# Patient Record
Sex: Female | Born: 1985 | Race: White | Hispanic: No | Marital: Single | Smoking: Never smoker
Health system: Southern US, Community
[De-identification: ages and names within clinical notes are randomized; demographics above are authoritative.]

## PROBLEM LIST (undated history)

## (undated) DIAGNOSIS — G4733 Obstructive sleep apnea (adult) (pediatric): Secondary | ICD-10-CM

## (undated) DIAGNOSIS — E282 Polycystic ovarian syndrome: Secondary | ICD-10-CM

## (undated) DIAGNOSIS — T8859XA Other complications of anesthesia, initial encounter: Secondary | ICD-10-CM

## (undated) DIAGNOSIS — F329 Major depressive disorder, single episode, unspecified: Secondary | ICD-10-CM

## (undated) DIAGNOSIS — F909 Attention-deficit hyperactivity disorder, unspecified type: Secondary | ICD-10-CM

## (undated) DIAGNOSIS — T4145XA Adverse effect of unspecified anesthetic, initial encounter: Secondary | ICD-10-CM

## (undated) DIAGNOSIS — F32A Depression, unspecified: Secondary | ICD-10-CM

## (undated) HISTORY — PX: LITHOTRIPSY: SUR834

## (undated) HISTORY — PX: OTHER SURGICAL HISTORY: SHX169

## (undated) HISTORY — DX: Attention-deficit hyperactivity disorder, unspecified type: F90.9

## (undated) HISTORY — DX: Obstructive sleep apnea (adult) (pediatric): G47.33

## (undated) HISTORY — DX: Major depressive disorder, single episode, unspecified: F32.9

## (undated) HISTORY — DX: Depression, unspecified: F32.A

## (undated) HISTORY — DX: Polycystic ovarian syndrome: E28.2

---

## 2010-11-23 HISTORY — PX: KIDNEY STONE SURGERY: SHX686

## 2011-06-30 ENCOUNTER — Encounter (INDEPENDENT_AMBULATORY_CARE_PROVIDER_SITE_OTHER): Payer: Self-pay | Admitting: General Surgery

## 2011-07-01 ENCOUNTER — Ambulatory Visit (INDEPENDENT_AMBULATORY_CARE_PROVIDER_SITE_OTHER): Payer: BC Managed Care – PPO | Admitting: Surgery

## 2011-08-04 ENCOUNTER — Encounter (INDEPENDENT_AMBULATORY_CARE_PROVIDER_SITE_OTHER): Payer: Self-pay | Admitting: General Surgery

## 2011-08-05 ENCOUNTER — Encounter (INDEPENDENT_AMBULATORY_CARE_PROVIDER_SITE_OTHER): Payer: Self-pay | Admitting: Surgery

## 2011-08-05 ENCOUNTER — Ambulatory Visit (INDEPENDENT_AMBULATORY_CARE_PROVIDER_SITE_OTHER): Payer: BC Managed Care – PPO | Admitting: Surgery

## 2011-08-05 DIAGNOSIS — F909 Attention-deficit hyperactivity disorder, unspecified type: Secondary | ICD-10-CM | POA: Insufficient documentation

## 2011-08-05 NOTE — Progress Notes (Signed)
Subjective:     Patient ID: Brianna Nicholson, female   DOB: [birth year], 25 y.o.   MRN: [id number]  HPI Brianna Nicholson is a 25 year old white female from [place] who is currently in graduate school at [employer] studying bioethics. She grew up in [place] where she was a Medical sales representative at [employer].  She has been overweight all of her life and both her parents have a family history is obesity as well as diabetes. Her comorbidities include PCO S., and depression. She also has ADHD and an kidney stones. Aside from the right flank incision that Dr. Barbette Hair placed in her right upper quadrant to repair her collecting system.  She has been to our seminar and wants to proceed with Roux-en-Y gastric bypass. I discussed LAP-BAND with her as well as bypass and sleeve. We'll go ahead and proceed along the journey toward Roux-en-Y gastric bypass. Past Medical History  Diagnosis Date  . Depression   . PCOS (polycystic ovarian syndrome)   . 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 ADHD (attention deficit hyperactivity disorder)    Current Outpatient Prescriptions  Medication Sig Dispense Refill  . methylphenidate (RITALIN) 20 MG tablet Take 20 mg by mouth 3 (three) times daily.         Past Surgical History  Procedure Date  . Lithotripsy 2010, 2011  . Upj obstruction 1984  . Kidney stone surgery 2012   Family History  Problem Relation Age of Onset  . Diabetes Mother   . Hypertension Mother   . Hyperlipidemia Mother   . Obesity Mother   . Obesity Father   . Diabetes Father   . Hyperlipidemia Father   . Hypertension Father    family history includes Diabetes in her father and mother; Hyperlipidemia in her father and mother; Hypertension in her father and mother; and Obesity in her father and mother. History   Social History  . Marital Status: Single    Spouse Name: N/A    Number of Children: N/A  . Years of Education:  N/A   Occupational History  . Not on file.   Social History Main Topics  . Smoking status: Never Smoker   . Smokeless tobacco: Not on file  . Alcohol Use: No  . Drug Use: No  . Sexually Active:    Other Topics Concern  . Not on file   Social History Narrative  . No narrative on file      Review of Systems  Constitutional: Negative.   HENT: Negative.   Eyes: Negative.   Respiratory: Negative.   Cardiovascular: Negative.   Gastrointestinal: Negative.   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  Musculoskeletal: Negative.   Neurological: Negative.   Psychiatric/Behavioral: Negative.        Objective:   Physical Exam  Constitutional: She is oriented to person, place, and time. She appears well-developed and well-nourished.  HENT:  Head: Normocephalic and atraumatic.  Eyes: Conjunctivae and EOM are normal. Pupils are equal, round, and reactive to light.  Neck: Neck supple.  Cardiovascular: Normal rate, regular rhythm and normal heart sounds.   Pulmonary/Chest: Effort normal and breath sounds normal.  Abdominal: Soft.       Right upper quadrant transverse incison  Musculoskeletal: Normal range of motion.       No history of DVT  Neurological:  She is alert and oriented to person, place, and time. She has normal reflexes.  Skin: Skin is warm.  Psychiatric: She has a normal mood and affect. Her behavior is normal. Judgment and thought content normal.  BP 142/90  Pulse 92  Temp(Src) 97.6 F (36.4 C) (Temporal)  Resp 20  Ht 5\' 6"  (1.676 m)  Wt 341 lb 12.8 oz (155.039 kg)  BMI 55.17 kg/m2  Filed Vitals:   08/05/11 1032  BP: 142/90  Pulse: 92  Temp: 97.6 F (36.4 C)  Resp: 20      Assessment:     Morbid obesity BMI 55 .  Would benefit from lap Roux en Y gastric bypass    Plan:     Begin bariatric workup for gastric bypass

## 2011-08-06 ENCOUNTER — Other Ambulatory Visit (INDEPENDENT_AMBULATORY_CARE_PROVIDER_SITE_OTHER): Payer: Self-pay | Admitting: Surgery

## 2011-08-12 ENCOUNTER — Encounter: Payer: BC Managed Care – PPO | Attending: Surgery | Admitting: *Deleted

## 2011-08-12 ENCOUNTER — Encounter: Payer: Self-pay | Admitting: *Deleted

## 2011-08-12 DIAGNOSIS — Z01818 Encounter for other preprocedural examination: Secondary | ICD-10-CM | POA: Insufficient documentation

## 2011-08-12 DIAGNOSIS — Z713 Dietary counseling and surveillance: Secondary | ICD-10-CM | POA: Insufficient documentation

## 2011-08-12 NOTE — Patient Instructions (Signed)
   Follow Pre-Op Nutrition Goals to prepare for Gastric Bypass Surgery.   Call the [facility] at [phone] once you have been given your surgery date to enrolled in the Pre-Op Nutrition Class. You will need to attend this nutrition class 3-4 weeks prior to your surgery. 

## 2011-08-12 NOTE — Progress Notes (Signed)
  Patient was seen on 08/12/2011 for Pre-Operative Gastric Bypass Nutrition Assessment. Assessment and letter of approval faxed to [facility] Bariatric Surgery Program coordinator on 08/12/2011.    Handouts given during visit include:  Pre-Op Goals Handout  Patient to call for Pre-Op and Post-Op Nutrition Education at the [facility] when surgery is scheduled.

## 2011-08-13 ENCOUNTER — Encounter (HOSPITAL_COMMUNITY): Payer: BC Managed Care – PPO

## 2011-08-17 ENCOUNTER — Encounter (HOSPITAL_COMMUNITY): Payer: BC Managed Care – PPO

## 2011-08-18 ENCOUNTER — Ambulatory Visit (HOSPITAL_BASED_OUTPATIENT_CLINIC_OR_DEPARTMENT_OTHER): Payer: BC Managed Care – PPO | Attending: Surgery

## 2011-08-18 ENCOUNTER — Ambulatory Visit (HOSPITAL_COMMUNITY): Admission: RE | Admit: 2011-08-18 | Payer: BC Managed Care – PPO | Source: Ambulatory Visit | Admitting: Surgery

## 2011-08-18 DIAGNOSIS — G4733 Obstructive sleep apnea (adult) (pediatric): Secondary | ICD-10-CM

## 2011-08-18 HISTORY — DX: Obstructive sleep apnea (adult) (pediatric): G47.33

## 2011-08-19 DIAGNOSIS — R0989 Other specified symptoms and signs involving the circulatory and respiratory systems: Secondary | ICD-10-CM

## 2011-08-19 DIAGNOSIS — R0609 Other forms of dyspnea: Secondary | ICD-10-CM

## 2011-08-19 DIAGNOSIS — M62838 Other muscle spasm: Secondary | ICD-10-CM

## 2011-08-19 DIAGNOSIS — G4733 Obstructive sleep apnea (adult) (pediatric): Secondary | ICD-10-CM

## 2011-08-19 NOTE — Procedures (Signed)
NAMEROBIN, PETRAKIS                   ACCOUNT NO.:  [id number]  MEDICAL RECORD NO.:  [id number]          PATIENT TYPE:  OUT  LOCATION:  SLEEP CENTER                 FACILITY[facility]  [facility]  PHYSICIAN:  Clinton D. Maple Hudson, MD, FCCP, FACPDATE OF BIRTH:  04/28/1988  DATE OF STUDY:  08/18/2011                           NOCTURNAL POLYSOMNOGRAM  REFERRING PHYSICIAN:  Thornton Park. Daphine Deutscher, MD  INDICATION FOR STUDY:  Hypersomnia with sleep apnea.  EPWORTH SLEEPINESS SCORE:  5/24.  BMI 55.7.  Weight 345 pounds, height 66 inches.  Neck 16.5 inches.  MEDICATIONS:  Home medications are charted and reviewed.  SLEEP ARCHITECTURE:  Split study protocol.  During the diagnostic phase, total sleep time 137.5 minutes with sleep efficiency 68.8%.  Stage I was 18.9%, stage II 81.1%, stage III and REM were absent.  Sleep latency 42.5 minutes, REM latency NA.  Awake after sleep onset 19 minutes, arousal index 43.2.  BEDTIME MEDICATION:  None.  RESPIRATORY DATA:  Split study protocol.  Apnea/hypopnea index (AHI) 85.1 per hour.  A total of 195 events were scored including 4 obstructive apneas and 191 hypopneas.  Events were associated with non- supine sleep.  REM was absent.  CPAP was then titrated to 11 CWP, AHI 0 per hour.  She wore a medium ResMed Mirage Quattro full-face mask with heated humidifier and a C-Flex setting of 3.  OXYGEN DATA:  Mild-to-moderate snoring before CPAP with oxygen desaturation to a nadir of 84% on room air.  With CPAP titration, mean oxygen saturation of 93.2% on room air and snoring was prevented.  CARDIAC DATA:  Normal sinus rhythm.  MOVEMENT-PARASOMNIA:  A few limb jerks were noted with insignificant effect on sleep.  No bathroom trips.  IMPRESSIONS-RECOMMENDATIONS: 1. Severe obstructive sleep apnea/hypopnea syndrome, AHI 85.1 per     hour.  Non-supine events with mild-to-moderate snoring and oxygen     desaturation to a nadir of 84% on room air. 2. Successful continuous positive  airway pressure titration to 11 CWP,     AHI 0 per hour.  She wore a medium     ResMed Mirage Quattro full-face mask with heated humidifier and C-     Flex setting of three.  Snoring was prevented and oxygenation     normalized.     Clinton D. Maple Hudson, MD, [facility], FACP Diplomate, Biomedical engineer of Sleep Medicine Electronically Signed    CDY/MEDQ  D:  08/19/2011 10:58:31  T:  08/19/2011 12:14:16  Job:  [id number]

## 2011-08-20 ENCOUNTER — Ambulatory Visit (HOSPITAL_COMMUNITY)
Admission: RE | Admit: 2011-08-20 | Discharge: 2011-08-20 | Disposition: A | Discharge status: Home / Self Care | Payer: BC Managed Care – PPO | Source: Ambulatory Visit | Attending: Surgery | Admitting: Surgery

## 2011-08-20 ENCOUNTER — Other Ambulatory Visit: Payer: Self-pay

## 2011-08-20 DIAGNOSIS — Z6841 Body Mass Index (BMI) 40.0 and over, adult: Secondary | ICD-10-CM | POA: Insufficient documentation

## 2011-08-20 DIAGNOSIS — K7689 Other specified diseases of liver: Secondary | ICD-10-CM | POA: Insufficient documentation

## 2011-08-20 DIAGNOSIS — Z13820 Encounter for screening for osteoporosis: Secondary | ICD-10-CM | POA: Insufficient documentation

## 2011-08-21 ENCOUNTER — Encounter (HOSPITAL_COMMUNITY): Payer: BC Managed Care – PPO

## 2011-08-25 ENCOUNTER — Encounter (HOSPITAL_COMMUNITY): Payer: BC Managed Care – PPO

## 2011-09-30 ENCOUNTER — Other Ambulatory Visit (INDEPENDENT_AMBULATORY_CARE_PROVIDER_SITE_OTHER): Payer: Self-pay

## 2011-09-30 ENCOUNTER — Other Ambulatory Visit (INDEPENDENT_AMBULATORY_CARE_PROVIDER_SITE_OTHER): Payer: Self-pay | Admitting: Surgery

## 2011-09-30 ENCOUNTER — Ambulatory Visit (INDEPENDENT_AMBULATORY_CARE_PROVIDER_SITE_OTHER): Payer: BC Managed Care – PPO | Admitting: Pulmonary Disease

## 2011-09-30 ENCOUNTER — Encounter: Payer: Self-pay | Admitting: *Deleted

## 2011-09-30 ENCOUNTER — Encounter: Payer: Self-pay | Admitting: Pulmonary Disease

## 2011-09-30 VITALS — BP 150/82 | HR 109 | Temp 99.1°F | Ht 66.0 in | Wt 342.6 lb

## 2011-09-30 DIAGNOSIS — F909 Attention-deficit hyperactivity disorder, unspecified type: Secondary | ICD-10-CM

## 2011-09-30 DIAGNOSIS — G4733 Obstructive sleep apnea (adult) (pediatric): Secondary | ICD-10-CM

## 2011-09-30 NOTE — Assessment & Plan Note (Signed)
She has severe sleep apnea.  I have reviewed his sleep test results with the patient.  Explained how sleep apnea can affect the patient's health.  Driving precautions and importance of weight loss were discussed.  Treatment options for sleep apnea were reviewed.  Will proceed with setting up CPAP 11 cm H2O.  Advised her that she can proceed with bariatric surgery.  She will need to have her sleep apnea re-evaluated after surgery once her weight stabilizes.

## 2011-09-30 NOTE — Progress Notes (Deleted)
  Subjective:    Patient ID: Brianna Nicholson, female    DOB: Apr 03, 1986, 25 y.o.   MRN: [id number]  HPI    Review of Systems  Constitutional: Positive for unexpected weight change.       Objective:   Physical Exam        Assessment & Plan:

## 2011-09-30 NOTE — Assessment & Plan Note (Signed)
Advised her that sleep apnea can mimic symptoms of ADHD.  She will need to have this re-evaluated once she is established on therapy for her sleep apnea.

## 2011-09-30 NOTE — Patient Instructions (Signed)
Will arrange for CPAP set up  Follow up in 3 months 

## 2011-09-30 NOTE — Progress Notes (Signed)
Chief Complaint  Patient presents with  . Advice Only    Pt had sleep study last month adn was not given results. Pt c/o difficulty sleeping x 7 years. Pt wake sup in middle night, has vivid dreams    History of Present Illness:  Brianna Nicholson is a 25 y.o. female for evaluation of sleep apnea.  She is being evaluated for bariatric surgery.  During this evaluation she had sleep study from August 18, 2011>>AHI 85 with SpO2 low 84%.  She used CPAP 11 cm H2O with reduction in AHI to 0.  She felt her sleep was improved with CPAP.  She goes to bed at 11 pm.  She can take up to an hour to fall asleep.  She will occasionally use a tylenol pm.  She wakes up several times during the night with vivid dreams.  She gets out of bed at 8 am.  She will feel groggy initially, but improve as the day goes on.  She denies morning headaches.  She will occasionally take ritalin for ADHD, but does not drink caffeine beverages.  She has been told she snores.  The patient denies sleep walking, sleep talking, bruxism, or nightmares.  There is no history of restless legs.  The patient denies sleep hallucinations, sleep paralysis, or cataplexy.  Her Epworth score is 5 out of 24.    Past Medical History  Diagnosis Date  . Depression   . PCOS (polycystic ovarian syndrome)   . 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 ADHD (attention deficit hyperactivity disorder)   . OSA (obstructive sleep apnea) 08/18/11    Past Surgical History  Procedure Date  . Lithotripsy 2010, 2011  . Upj obstruction 1984  . Kidney stone surgery 2012    Current Outpatient Prescriptions on File Prior to Visit  Medication Sig Dispense Refill  . methylphenidate (RITALIN) 20 MG tablet Take 20 mg by mouth 3 (three) times daily.          No Known Allergies  family history includes COPD in her mother; Cancer in her maternal grandmother; Colon cancer in her paternal  uncle; Diabetes in her father and mother; Hyperlipidemia in her father and mother; Hypertension in her father and mother; and Obesity in her father and mother.   reports that she has never smoked. She does not have any smokeless tobacco history on file. She reports that she does not drink alcohol or use illicit drugs.  Blood pressure 150/82, pulse 109, temperature 99.1 F (37.3 C), temperature source Oral, height 5\' 6"  (1.676 m), weight 342 lb 9.6 oz (155.402 kg), SpO2 97.00%.  Physical Exam:  General - Obese HEENT - PERRLA, EOMI, no sinus tenderness, MP 4, enlarged tongue, no LAN, no thyromegaly Cardiac - s1s2 regular Chest - no wheeze/rales Abdomen - soft, nontender Extremities - no e/c/c Neurologic - normal strength, CN intact Skin - no rashes Psychiatric - normal mood, behavior  Assessment/Plan:  OSA (obstructive sleep apnea) She has severe sleep apnea.  I have reviewed his sleep test results with the patient.  Explained how sleep apnea can affect the patient's health.  Driving precautions and importance of weight loss were discussed.  Treatment options for sleep apnea were reviewed.  Will proceed with setting up CPAP 11 cm H2O.  Advised her that she can proceed with bariatric surgery.  She will need to have her sleep apnea re-evaluated after surgery once her weight stabilizes.  ADHD (attention deficit hyperactivity disorder) Advised her that sleep apnea can mimic symptoms of ADHD.  She will need to have this re-evaluated once she is established on therapy for her sleep apnea.     Outpatient Encounter Prescriptions as of 09/30/2011  Medication Sig Dispense Refill  . methylphenidate (RITALIN) 20 MG tablet Take 20 mg by mouth 3 (three) times daily.          Brianna Nicholson 09/30/2011, 3:06 PM

## 2011-10-01 LAB — COMPREHENSIVE METABOLIC PANEL
Alkaline Phosphatase: 70 U/L (ref 39–117)
BUN: 9 mg/dL (ref 6–23)
Glucose, Bld: 91 mg/dL (ref 70–99)
Sodium: 139 mEq/L (ref 135–145)
Total Bilirubin: 0.5 mg/dL (ref 0.3–1.2)

## 2011-10-01 LAB — CBC WITH DIFFERENTIAL/PLATELET
Basophils Relative: 0 % (ref 0–1)
Eosinophils Absolute: 0.1 10*3/uL (ref 0.0–0.7)
Hemoglobin: 14.8 g/dL (ref 12.0–15.0)
MCH: 31 pg (ref 26.0–34.0)
MCHC: 34.2 g/dL (ref 30.0–36.0)
Monocytes Absolute: 0.6 10*3/uL (ref 0.1–1.0)
Monocytes Relative: 6 % (ref 3–12)
Neutrophils Relative %: 69 % (ref 43–77)

## 2011-10-01 LAB — LIPID PANEL
HDL: 40 mg/dL (ref >39)
LDL Cholesterol: 159 mg/dL — ABNORMAL HIGH (ref 0–99)
Total CHOL/HDL Ratio: 6.2 Ratio
Triglycerides: 238 mg/dL — ABNORMAL HIGH (ref <150)
VLDL: 48 mg/dL — ABNORMAL HIGH (ref 0–40)

## 2011-10-16 ENCOUNTER — Encounter: Payer: Self-pay | Admitting: Surgery

## 2012-01-15 ENCOUNTER — Ambulatory Visit: Payer: BC Managed Care – PPO | Admitting: *Deleted

## 2012-01-26 NOTE — Patient Instructions (Signed)
Follow:    Pre-Op Diet per MD 2 weeks prior to surgery  Phase 2- Liquids (clear/full) 2 weeks after surgery  Vitamin/Mineral/Calcium guidelines for purchasing bariatric supplements  Exercise guidelines pre and post-op per MD  Follow-up at [facility] in 2 weeks post-op for diet advancement. Contact Markella Dao as needed with questions/concerns.  

## 2012-01-26 NOTE — Progress Notes (Signed)
  Bariatric Class:  Appt start time: 0830 end time:  0930.  Pre-Operative Nutrition Class  Patient was seen on 01/28/12 for Pre-Operative Bariatric Surgery Education at the [facility].  Surgery date: 3/25 Surgery type: Gastric Bypass Last weight (08/12/11): 337.1 lbs  Samples given per MNT protocol: Bariatric Advantage Multivitamin Lot # [id number] Exp: 9/13  Bariatric Advantage Calcium Citrate Lot # [id number] Exp: 12/13  Bariatric Advantage B12 Lot # [id number] Exp: 5/13  Celebrate Vitamins Multivitamin Lot # [id number] Exp: 6/14  Celebrate VitaminsCalcium Citrate Lot # [id number] Exp: 10/14  Unjury Protein - Chicken Soup Lot# [id number] Exp: 5/14  The following the learning objective met by the patient during this course:   Identifies Pre-Op Dietary Goals and will begin 2 weeks pre-operatively   Identifies appropriate sources of fluids and proteins   States protein recommendations and appropriate sources pre and post-operatively  Identifies Post-Operative Dietary Goals and will follow for 2 weeks post-operatively  Identifies appropriate multivitamin and calcium sources  Describes the need for physical activity post-operatively and will follow MD recommendations  States when to call healthcare provider regarding medication questions or post-operative complications  Handouts given during class include:  Pre-Op Bariatric Surgery Diet Handout  Protein Shake Handout  Post-Op Bariatric Surgery Nutrition Handout  BELT Program Information Flyer  Support Group Information Flyer  Follow-Up Plan: Patient will follow-up at [facility] 2 weeks post operatively for diet advancement per MD.

## 2012-01-28 ENCOUNTER — Encounter: Payer: BC Managed Care – PPO | Attending: Surgery

## 2012-01-28 DIAGNOSIS — Z01818 Encounter for other preprocedural examination: Secondary | ICD-10-CM | POA: Insufficient documentation

## 2012-01-28 DIAGNOSIS — Z713 Dietary counseling and surveillance: Secondary | ICD-10-CM | POA: Insufficient documentation

## 2012-02-03 ENCOUNTER — Encounter (HOSPITAL_COMMUNITY): Payer: Self-pay | Admitting: Pharmacy Technician

## 2012-02-05 ENCOUNTER — Ambulatory Visit (INDEPENDENT_AMBULATORY_CARE_PROVIDER_SITE_OTHER): Payer: BC Managed Care – PPO | Admitting: Surgery

## 2012-02-05 ENCOUNTER — Encounter (INDEPENDENT_AMBULATORY_CARE_PROVIDER_SITE_OTHER): Payer: Self-pay | Admitting: Surgery

## 2012-02-05 VITALS — BP 136/98 | HR 100 | Temp 98.2°F | Resp 18 | Ht 66.0 in | Wt 343.1 lb

## 2012-02-05 DIAGNOSIS — E669 Obesity, unspecified: Secondary | ICD-10-CM | POA: Insufficient documentation

## 2012-02-05 NOTE — Progress Notes (Signed)
Subjective:     Patient ID: Brianna Nicholson, female   DOB: 1986-01-06, 26 y.o.   MRN: [id number]  HPI Brianna Nicholson is a 26 year old white female from [place] who is currently in graduate school at [employer] studying bioethics. 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  She has been overweight all of her life and both her parents have a family history is obesity as well as diabetes. Her comorbidities include PCO S., and depression. She also has ADHD and an kidney stones. Aside from the right flank incision that Dr. Barbette Hair placed in her right upper quadrant to repair her collecting system.  She has been to our seminar and wants to proceed with Roux-en-Y gastric bypass. I discussed LAP-BAND with her as well as bypass and sleeve. We'll go ahead and proceed along the journey toward Roux-en-Y gastric bypass. UGI normal.  Ultra sound no gallstones.   Past Medical History  Diagnosis Date  . Depression   . PCOS (polycystic ovarian syndrome)   . 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 ADHD (attention deficit hyperactivity disorder)   . OSA (obstructive sleep apnea) 08/18/11   Current Outpatient Prescriptions  Medication Sig Dispense Refill  . methylphenidate (RITALIN) 20 MG tablet Take 20 mg by mouth 3 (three) times daily. Patient states that she only takes as needed. Maybe 5 to 7 tablets per week.       Past Surgical History  Procedure Date  . Lithotripsy 2010, 2011  . Upj obstruction 1984  . Kidney stone surgery 2012   Family History  Problem Relation Age of Onset  . Diabetes Mother   . Hypertension Mother   . Hyperlipidemia Mother   . Obesity Mother   . Obesity Father   . Diabetes Father   . Hyperlipidemia Father   . Hypertension Father   . COPD Mother   . Cancer Maternal Grandmother   . Colon cancer Paternal Uncle    family history includes COPD in her mother; Cancer in her  maternal grandmother; Colon cancer in her paternal uncle; Diabetes in her father and mother; Hyperlipidemia in her father and mother; Hypertension in her father and mother; and Obesity in her father and mother. History   Social History  . Marital Status: Single    Spouse Name: N/A    Number of Children: N/A  . Years of Education: N/A   Occupational History  . grad student/data entry clerk     [employer]   Social History Main Topics  . Smoking status: Never Smoker   . Smokeless tobacco: Not on file  . Alcohol Use: No  . Drug Use: No  . Sexually Active: Not on file   Other Topics Concern  . Not on file   Social History Narrative  . No narrative on file      Review of Systems  Constitutional: Negative.   HENT: Negative.   Eyes: Negative.   Respiratory: Negative.   Cardiovascular: Negative.   Gastrointestinal: Negative.   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  Musculoskeletal: Negative.   Neurological: Negative.   Psychiatric/Behavioral: Negative.        Objective:   Physical Exam  Constitutional: She is oriented to person, place, and time. She appears well-developed and well-nourished.  HENT:  Head: Normocephalic and atraumatic.  Eyes: Conjunctivae and EOM are normal. Pupils are equal, round, and reactive to light.  Neck: Neck supple.  Cardiovascular: Normal rate, regular rhythm and normal heart sounds.   Pulmonary/Chest: Effort normal and breath sounds normal.  Abdominal: Soft.       Right upper quadrant transverse incison  Musculoskeletal: Normal range of motion.       No history of DVT  Neurological: She is alert and oriented to person, place, and time. She has normal reflexes.  Skin: Skin is warm.  Psychiatric: She has a normal mood and affect. Her behavior is normal. Judgment and thought content normal.  BP 136/98  Pulse 100  Temp(Src) 98.2 F (36.8 C) (Temporal)   Resp 18  Ht 5\' 6"  (1.676 m)  Wt 343 lb 2 oz (155.64 kg)  BMI 55.38 kg/m2  Filed Vitals:   02/05/12 1414  BP: 136/98  Pulse: 100  Temp: 98.2 F (36.8 C)  Resp: 18      Assessment:     Morbid obesity BMI 55 .  Would benefit from lap Roux en Y gastric bypass    Plan:    HG A1C elevated.  Plan lap roux en Y gastric bypass

## 2012-02-09 ENCOUNTER — Encounter (HOSPITAL_COMMUNITY)
Admission: RE | Admit: 2012-02-09 | Discharge: 2012-02-09 | Disposition: A | Discharge status: Home / Self Care | Payer: BC Managed Care – PPO | Source: Ambulatory Visit | Attending: Surgery | Admitting: Surgery

## 2012-02-09 ENCOUNTER — Encounter (HOSPITAL_COMMUNITY): Payer: Self-pay

## 2012-02-09 HISTORY — DX: Adverse effect of unspecified anesthetic, initial encounter: T41.45XA

## 2012-02-09 HISTORY — DX: Other complications of anesthesia, initial encounter: T88.59XA

## 2012-02-09 LAB — DIFFERENTIAL
Basophils Absolute: 0.1 K/uL (ref 0.0–0.1)
Basophils Relative: 1 % (ref 0–1)
Eosinophils Absolute: 0.1 K/uL (ref 0.0–0.7)
Eosinophils Relative: 1 % (ref 0–5)
Lymphocytes Relative: 31 % (ref 12–46)
Lymphs Abs: 2.9 K/uL (ref 0.7–4.0)
Monocytes Absolute: 0.5 K/uL (ref 0.1–1.0)
Monocytes Relative: 5 % (ref 3–12)
Neutro Abs: 5.6 K/uL (ref 1.7–7.7)
Neutrophils Relative %: 62 % (ref 43–77)

## 2012-02-09 LAB — CBC
HCT: 40.5 % (ref 36.0–46.0)
Hemoglobin: 14.3 g/dL (ref 12.0–15.0)
RDW: 12.2 % (ref 11.5–15.5)
WBC: 9.2 10*3/uL (ref 4.0–10.5)

## 2012-02-09 LAB — COMPREHENSIVE METABOLIC PANEL
ALT: 37 U/L — ABNORMAL HIGH (ref 0–35)
Albumin: 4.1 g/dL (ref 3.5–5.2)
Alkaline Phosphatase: 87 U/L (ref 39–117)
BUN: 6 mg/dL (ref 6–23)
Chloride: 104 mEq/L (ref 96–112)
Glucose, Bld: 100 mg/dL — ABNORMAL HIGH (ref 70–99)
Potassium: 3.8 mEq/L (ref 3.5–5.1)
Total Bilirubin: 0.3 mg/dL (ref 0.3–1.2)

## 2012-02-09 LAB — SURGICAL PCR SCREEN
MRSA, PCR: NEGATIVE
Staphylococcus aureus: NEGATIVE

## 2012-02-09 NOTE — Patient Instructions (Signed)
YOUR SURGERY IS SCHEDULED ON:   Monday  3/25  AT 11:41 AM  REPORT TO [facility] AT:  9:15 AM      PHONE # FOR SHORT STAY IS [phone]  DO NOT EAT OR DRINK ANYTHING AFTER MIDNIGHT THE NIGHT BEFORE YOUR SURGERY.  YOU MAY BRUSH YOUR TEETH, RINSE OUT YOUR MOUTH--BUT NO WATER, NO FOOD, NO CHEWING GUM, NO MINTS, NO CANDIES, NO CHEWING TOBACCO.  PLEASE TAKE THE FOLLOWING MEDICATIONS THE AM OF YOUR SURGERY WITH A FEW SIPS OF WATER:  NO MEDS TO TAKE    IF YOU USE INHALERS--USE YOUR INHALERS THE AM OF YOUR SURGERY AND BRING INHALERS TO THE HOSPITAL -TAKE TO SURGERY.    IF YOU ARE DIABETIC:  DO NOT TAKE ANY DIABETIC MEDICATIONS THE AM OF YOUR SURGERY.  IF YOU TAKE INSULIN IN THE EVENINGS--PLEASE ONLY TAKE 1/2 NORMAL EVENING DOSE THE NIGHT BEFORE YOUR SURGERY.  NO INSULIN THE AM OF YOUR SURGERY.  IF YOU HAVE SLEEP APNEA AND USE CPAP OR BIPAP--PLEASE BRING THE MASK --NOT THE MACHINE-NOT THE TUBING   -JUST THE MASK. DO NOT BRING VALUABLES, MONEY, CREDIT CARDS.  CONTACT LENS, DENTURES / PARTIALS, GLASSES SHOULD NOT BE WORN TO SURGERY AND IN MOST CASES-HEARING AIDS WILL NEED TO BE REMOVED.  BRING YOUR GLASSES CASE, ANY EQUIPMENT NEEDED FOR YOUR CONTACT LENS. FOR PATIENTS ADMITTED TO THE HOSPITAL--CHECK OUT TIME THE DAY OF DISCHARGE IS 11:00 AM.  ALL INPATIENT ROOMS ARE PRIVATE - WITH BATHROOM, TELEPHONE, TELEVISION AND WIFI INTERNET. IF YOU ARE BEING DISCHARGED THE SAME DAY OF YOUR SURGERY--YOU CAN NOT DRIVE YOURSELF HOME--AND SHOULD NOT GO HOME ALONE BY TAXI OR BUS.  NO DRIVING OR OPERATING MACHINERY FOR 24 HOURS FOLLOWING ANESTHESIA / PAIN MEDICATIONS.                            SPECIAL INSTRUCTIONS:  CHLORHEXIDINE SOAP SHOWER (other brand names are Betasept and Hibiclens ) PLEASE SHOWER WITH CHLORHEXIDINE THE NIGHT BEFORE YOUR SURGERY AND THE AM OF YOUR SURGERY. DO NOT USE CHLORHEXIDINE ON YOUR FACE OR PRIVATE AREAS--YOU MAY USE YOUR NORMAL SOAP THOSE AREAS AND YOUR NORMAL SHAMPOO.    WOMEN SHOULD AVOID SHAVING UNDER ARMS AND SHAVING LEGS 48 HOURS BEFORE USING CHLORHEXIDINE TO AVOID SKIN IRRITATION.  DO NOT USE IF ALLERGIC TO CHLORHEXIDINE.  PLEASE READ OVER ANY  FACT SHEETS THAT YOU WERE GIVEN: MRSA INFORMATION, INCENTIVE SPIROMETER INFORMATION.

## 2012-02-09 NOTE — Pre-Procedure Instructions (Signed)
EKG AND CXR REPORTS 08/20/11 FROM [facility] ARE IN EPIC AND COPIES ON THIS CHART. SLEEP STUDY REPORT 08/18/11 FROM [facility] ON THIS CHART AND IN EPIC. CBC WITH DIFF, CMET WERE DONE TODAY--STAT URINE PREGNACY TEST TO BE DONE ON PT'S ARRIVAL FOR SURGERY.

## 2012-02-13 ENCOUNTER — Telehealth (INDEPENDENT_AMBULATORY_CARE_PROVIDER_SITE_OTHER): Payer: Self-pay | Admitting: General Surgery

## 2012-02-13 NOTE — Telephone Encounter (Signed)
She had questions about how to take her bowel prep and I gave her instructions on that.

## 2012-02-14 MED ORDER — DEXTROSE 5 % IV SOLN
2.0000 g | INTRAVENOUS | Status: AC
  Administered 2012-02-15: 2 g via INTRAVENOUS
  Filled 2012-02-14: qty 2

## 2012-02-14 NOTE — H&P (Addendum)
Patient ID: Brianna Nicholson, female DOB: 02-19-1986, 26 y.o. MRN: [id number]  HPI Brianna Nicholson is a 26 year old white female from [place] who is currently in graduate school at [employer] studying bioethics. She grew up in [place] where she was a Medical sales representative at [employer]. She has been overweight all of her life and both her parents have a family history is obesity as well as diabetes. Her comorbidities include PCO S., and depression. She also has ADHD and an kidney stones. Aside from the right flank incision that Dr. Barbette Hair placed in her right upper quadrant to repair her collecting system.  She has been to our seminar and wants to proceed with Roux-en-Y gastric bypass. I discussed LAP-BAND with her as well as bypass and sleeve. We'll go ahead and proceed along the journey toward Roux-en-Y gastric bypass. UGI normal. Ultra sound no gallstones.  Past Medical History   Diagnosis  Date   .  Depression    .  PCOS (polycystic ovarian syndrome)    .  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  ADHD (attention deficit hyperactivity disorder)    .  OSA (obstructive sleep apnea)  08/18/11    Current Outpatient Prescriptions   Medication  Sig  Dispense  Refill   .  methylphenidate (RITALIN) 20 MG tablet  Take 20 mg by mouth 3 (three) times daily. Patient states that she only takes as needed. Maybe 5 to 7 tablets per week.      Past Surgical History   Procedure  Date   .  Lithotripsy  2010, 2011   .  Upj obstruction  1984   .  Kidney stone surgery  2012    Family History   Problem  Relation  Age of Onset   .  Diabetes  Mother    .  Hypertension  Mother    .  Hyperlipidemia  Mother    .  Obesity  Mother    .  Obesity  Father    .  Diabetes  Father    .  Hyperlipidemia  Father    .  Hypertension  Father    .  COPD  Mother    .  Cancer  Maternal Grandmother    .  Colon cancer  Paternal Uncle     family history  includes COPD in her mother; Cancer in her maternal grandmother; Colon cancer in her paternal uncle; Diabetes in her father and mother; Hyperlipidemia in her father and mother; Hypertension in her father and mother; and Obesity in her father and mother.  History    Social History   .  Marital Status:  Single     Spouse Name:  N/A     Number of Children:  N/A   .  Years of Education:  N/A    Occupational History   .  grad student/data entry clerk      [employer]    Social History Main Topics   .  Smoking status:  Never Smoker   .  Smokeless tobacco:  Not on file   .  Alcohol Use:  No   .  Drug Use:  No   .  Sexually Active:  Not on file    Other Topics  Concern   .  Not on file    Social History Narrative   .  No narrative on file    Review  of Systems  Constitutional: Negative.  HENT: Negative.  Eyes: Negative.  Respiratory: Negative.  Cardiovascular: Negative.  Gastrointestinal: Negative.  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  Musculoskeletal: Negative.  Neurological: Negative.  Psychiatric/Behavioral: Negative.   Objective:   Physical Exam  Constitutional: She is oriented to person, place, and time. She appears well-developed and well-nourished.  HENT:  Head: Normocephalic and atraumatic.  Eyes: Conjunctivae and EOM are normal. Pupils are equal, round, and reactive to light.  Neck: Neck supple.  Cardiovascular: Normal rate, regular rhythm and normal heart sounds.  Pulmonary/Chest: Effort normal and breath sounds normal.  Abdominal: Soft.  Right upper quadrant transverse incison  Musculoskeletal: Normal range of motion.  No history of DVT  Neurological: She is alert and oriented to person, place, and time. She has normal reflexes.  Skin: Skin is warm.  Psychiatric: She has a normal mood and affect. Her behavior is normal. Judgment and thought content normal.  BP 136/98  Pulse  100  Temp(Src) 98.2 F (36.8 C) (Temporal)  Resp 18  Ht 5\' 6"  (1.676 m)  Wt 343 lb 2 oz (155.64 kg)  BMI 55.38 kg/m2  Filed Vitals:    02/05/12 1414   BP:  136/98   Pulse:  100   Temp:  98.2 F (36.8 C)   Resp:  18     Assessment:    Morbid obesity BMI 55 . Would benefit from lap Roux en Y gastric bypass   Plan:   HG A1C elevated. Plan lap roux en Y gastric bypass  Matt B. Daphine Deutscher, MD, [facility] [phone] beeper [phone]  02/14/2012 10:35 PM  There has been no change in the patient's past medical history or physical exam in the past 24 hours to the best of my knowledge. I examined the patient in the holding area and have made any changes to the history and physical exam report that is included above.   Expectations and outcome results have been discussed with the patient to include risks and benefits.  All questions have been answered and we will proceed with previously discussed procedure noted and signed in the consent form in the patient's record.    Darene Nappi BMD FACS 11:53 AM  02/15/2012

## 2012-02-15 ENCOUNTER — Encounter (HOSPITAL_COMMUNITY): Payer: Self-pay | Admitting: Certified Registered Nurse Anesthetist

## 2012-02-15 ENCOUNTER — Encounter (HOSPITAL_COMMUNITY)
Admission: RE | Disposition: A | Discharge status: Home / Self Care | Payer: Self-pay | Source: Ambulatory Visit | Attending: Surgery

## 2012-02-15 ENCOUNTER — Inpatient Hospital Stay (HOSPITAL_COMMUNITY)
Admission: RE | Admit: 2012-02-15 | Discharge: 2012-02-17 | DRG: 288 | Disposition: A | Discharge status: Home / Self Care | Payer: BC Managed Care – PPO | Source: Ambulatory Visit | Attending: Surgery | Admitting: Surgery

## 2012-02-15 ENCOUNTER — Ambulatory Visit (HOSPITAL_COMMUNITY): Payer: BC Managed Care – PPO | Admitting: Certified Registered Nurse Anesthetist

## 2012-02-15 ENCOUNTER — Encounter (HOSPITAL_COMMUNITY): Payer: Self-pay | Admitting: *Deleted

## 2012-02-15 DIAGNOSIS — F3289 Other specified depressive episodes: Secondary | ICD-10-CM | POA: Diagnosis present

## 2012-02-15 DIAGNOSIS — Z01812 Encounter for preprocedural laboratory examination: Secondary | ICD-10-CM

## 2012-02-15 DIAGNOSIS — Z6841 Body Mass Index (BMI) 40.0 and over, adult: Secondary | ICD-10-CM

## 2012-02-15 DIAGNOSIS — F909 Attention-deficit hyperactivity disorder, unspecified type: Secondary | ICD-10-CM | POA: Diagnosis present

## 2012-02-15 DIAGNOSIS — G4733 Obstructive sleep apnea (adult) (pediatric): Secondary | ICD-10-CM | POA: Diagnosis present

## 2012-02-15 DIAGNOSIS — E669 Obesity, unspecified: Secondary | ICD-10-CM

## 2012-02-15 DIAGNOSIS — F329 Major depressive disorder, single episode, unspecified: Secondary | ICD-10-CM | POA: Diagnosis present

## 2012-02-15 HISTORY — PX: GASTRIC ROUX-EN-Y: SHX5262

## 2012-02-15 LAB — CBC
HCT: 39.5 % (ref 36.0–46.0)
Hemoglobin: 13.4 g/dL (ref 12.0–15.0)
MCH: 30.6 pg (ref 26.0–34.0)
MCV: 90.2 fL (ref 78.0–100.0)
Platelets: 288 10*3/uL (ref 150–400)
RBC: 4.38 MIL/uL (ref 3.87–5.11)
RDW: 12.3 % (ref 11.5–15.5)
WBC: 16.3 10*3/uL — ABNORMAL HIGH (ref 4.0–10.5)

## 2012-02-15 LAB — CREATININE, SERUM
Creatinine, Ser: 0.72 mg/dL (ref 0.50–1.10)
GFR calc non Af Amer: >90 mL/min (ref >90)

## 2012-02-15 SURGERY — LAPAROSCOPIC ROUX-EN-Y GASTRIC
Anesthesia: General | Site: Abdomen | Wound class: Clean Contaminated

## 2012-02-15 MED ORDER — MORPHINE SULFATE 4 MG/ML IJ SOLN
INTRAMUSCULAR | Status: AC
  Filled 2012-02-15: qty 1

## 2012-02-15 MED ORDER — ACETAMINOPHEN 160 MG/5ML PO SOLN: 650.0000 mg | ORAL | Status: DC | PRN

## 2012-02-15 MED ORDER — ACETAMINOPHEN 10 MG/ML IV SOLN
INTRAVENOUS | Status: AC
  Filled 2012-02-15: qty 100

## 2012-02-15 MED ORDER — KCL IN DEXTROSE-NACL 20-5-0.45 MEQ/L-%-% IV SOLN
INTRAVENOUS | Status: DC
  Administered 2012-02-15 – 2012-02-17 (×5): via INTRAVENOUS
  Filled 2012-02-15 (×7): qty 1000

## 2012-02-15 MED ORDER — ROCURONIUM BROMIDE 100 MG/10ML IV SOLN
INTRAVENOUS | Status: DC | PRN
  Administered 2012-02-15: 20 mg via INTRAVENOUS
  Administered 2012-02-15: 50 mg via INTRAVENOUS
  Administered 2012-02-15 (×2): 20 mg via INTRAVENOUS
  Administered 2012-02-15 (×2): 10 mg via INTRAVENOUS
  Administered 2012-02-15 (×2): 20 mg via INTRAVENOUS

## 2012-02-15 MED ORDER — GLYCOPYRROLATE 0.2 MG/ML IJ SOLN
INTRAMUSCULAR | Status: DC | PRN
  Administered 2012-02-15: 0.6 mg via INTRAVENOUS

## 2012-02-15 MED ORDER — DEXAMETHASONE SODIUM PHOSPHATE 10 MG/ML IJ SOLN
INTRAMUSCULAR | Status: DC | PRN
  Administered 2012-02-15: 10 mg via INTRAVENOUS

## 2012-02-15 MED ORDER — HEPARIN SODIUM (PORCINE) 5000 UNIT/ML IJ SOLN
5000.0000 [IU] | INTRAMUSCULAR | Status: AC
  Administered 2012-02-15: 5000 [IU] via SUBCUTANEOUS

## 2012-02-15 MED ORDER — LACTATED RINGERS IV SOLN
INTRAVENOUS | Status: DC | PRN
  Administered 2012-02-15 (×3): via INTRAVENOUS

## 2012-02-15 MED ORDER — ONDANSETRON HCL 4 MG/2ML IJ SOLN
INTRAMUSCULAR | Status: DC | PRN
  Administered 2012-02-15: 4 mg via INTRAVENOUS

## 2012-02-15 MED ORDER — LABETALOL HCL 5 MG/ML IV SOLN
INTRAVENOUS | Status: DC | PRN
  Administered 2012-02-15 (×2): 5 mg via INTRAVENOUS

## 2012-02-15 MED ORDER — FIBRIN SEALANT COMPONENT 5 ML EX KIT
PACK | CUTANEOUS | Status: AC
Start: 2012-02-15 — End: 2012-02-15
  Filled 2012-02-15: qty 2

## 2012-02-15 MED ORDER — HEPARIN SODIUM (PORCINE) 5000 UNIT/ML IJ SOLN
5000.0000 [IU] | Freq: Three times a day (TID) | INTRAMUSCULAR | Status: DC
Start: 2012-02-15 — End: 2012-02-17
  Administered 2012-02-15 – 2012-02-17 (×5): 5000 [IU] via SUBCUTANEOUS
  Filled 2012-02-15 (×11): qty 1

## 2012-02-15 MED ORDER — BUPIVACAINE LIPOSOME 1.3 % IJ SUSP
20.0000 mL | Freq: Once | INTRAMUSCULAR | Status: DC
  Filled 2012-02-15: qty 20

## 2012-02-15 MED ORDER — LIDOCAINE HCL (CARDIAC) 20 MG/ML IV SOLN
INTRAVENOUS | Status: DC | PRN
  Administered 2012-02-15: 60 mg via INTRAVENOUS

## 2012-02-15 MED ORDER — UNJURY VANILLA POWDER: 2.0000 [oz_av] | Freq: Four times a day (QID) | ORAL | Status: DC

## 2012-02-15 MED ORDER — ONDANSETRON HCL 4 MG/2ML IJ SOLN: 4.0000 mg | INTRAMUSCULAR | Status: DC | PRN

## 2012-02-15 MED ORDER — CEFOXITIN SODIUM-DEXTROSE 1-4 GM-% IV SOLR (PREMIX)
INTRAVENOUS | Status: AC
  Filled 2012-02-15: qty 100

## 2012-02-15 MED ORDER — PROMETHAZINE HCL 25 MG/ML IJ SOLN: 6.2500 mg | INTRAMUSCULAR | Status: DC | PRN

## 2012-02-15 MED ORDER — BUPIVACAINE LIPOSOME 1.3 % IJ SUSP
INTRAMUSCULAR | Status: DC | PRN
  Administered 2012-02-15: 20 mL

## 2012-02-15 MED ORDER — ACETAMINOPHEN 10 MG/ML IV SOLN
INTRAVENOUS | Status: DC | PRN
  Administered 2012-02-15: 1000 mg via INTRAVENOUS

## 2012-02-15 MED ORDER — HEPARIN SODIUM (PORCINE) 5000 UNIT/ML IJ SOLN
INTRAMUSCULAR | Status: AC
  Administered 2012-02-16: 5000 [IU] via SUBCUTANEOUS
  Filled 2012-02-15: qty 1

## 2012-02-15 MED ORDER — OXYCODONE-ACETAMINOPHEN 5-325 MG/5ML PO SOLN
5.0000 mL | ORAL | Status: DC | PRN
  Administered 2012-02-16 – 2012-02-17 (×5): 10 mL via ORAL
  Filled 2012-02-15 (×5): qty 10

## 2012-02-15 MED ORDER — MORPHINE SULFATE 10 MG/ML IJ SOLN
2.0000 mg | INTRAMUSCULAR | Status: DC | PRN
Start: 2012-02-15 — End: 2012-02-17
  Administered 2012-02-15 (×2): 6 mg via INTRAVENOUS
  Administered 2012-02-15 – 2012-02-16 (×3): 4 mg via INTRAVENOUS
  Administered 2012-02-16 (×4): 6 mg via INTRAVENOUS
  Filled 2012-02-15 (×8): qty 1

## 2012-02-15 MED ORDER — 0.9 % SODIUM CHLORIDE (POUR BTL) OPTIME
TOPICAL | Status: DC | PRN
  Administered 2012-02-15: 1000 mL

## 2012-02-15 MED ORDER — SUCCINYLCHOLINE CHLORIDE 20 MG/ML IJ SOLN
INTRAMUSCULAR | Status: DC | PRN
  Administered 2012-02-15: 140 mg via INTRAVENOUS

## 2012-02-15 MED ORDER — PROPOFOL 10 MG/ML IV EMUL
INTRAVENOUS | Status: DC | PRN
  Administered 2012-02-15: 280 mg via INTRAVENOUS

## 2012-02-15 MED ORDER — FENTANYL CITRATE 0.05 MG/ML IJ SOLN
INTRAMUSCULAR | Status: DC | PRN
  Administered 2012-02-15: 100 ug via INTRAVENOUS
  Administered 2012-02-15: 50 ug via INTRAVENOUS
  Administered 2012-02-15: 100 ug via INTRAVENOUS
  Administered 2012-02-15: 50 ug via INTRAVENOUS
  Administered 2012-02-15: 100 ug via INTRAVENOUS
  Administered 2012-02-15 (×3): 50 ug via INTRAVENOUS

## 2012-02-15 MED ORDER — HYDROMORPHONE HCL PF 1 MG/ML IJ SOLN: 0.2500 mg | INTRAMUSCULAR | Status: DC | PRN

## 2012-02-15 MED ORDER — TISSEEL VH 10 ML EX KIT
PACK | CUTANEOUS | Status: DC | PRN
  Administered 2012-02-15: 1

## 2012-02-15 MED ORDER — NEOSTIGMINE METHYLSULFATE 1 MG/ML IJ SOLN
INTRAMUSCULAR | Status: DC | PRN
  Administered 2012-02-15: 5 mg via INTRAVENOUS

## 2012-02-15 MED ORDER — UNJURY CHICKEN SOUP POWDER: 2.0000 [oz_av] | Freq: Four times a day (QID) | ORAL | Status: DC

## 2012-02-15 MED ORDER — UNJURY CHOCOLATE CLASSIC POWDER
2.0000 [oz_av] | Freq: Four times a day (QID) | ORAL | Status: DC
  Administered 2012-02-17: 2 [oz_av] via ORAL

## 2012-02-15 MED ORDER — LACTATED RINGERS IV SOLN
INTRAVENOUS | Status: DC
  Administered 2012-02-15: 1000 mL via INTRAVENOUS

## 2012-02-15 MED ORDER — MIDAZOLAM HCL 5 MG/5ML IJ SOLN
INTRAMUSCULAR | Status: DC | PRN
  Administered 2012-02-15: 2 mg via INTRAVENOUS

## 2012-02-15 MED ORDER — LACTATED RINGERS IR SOLN
Status: DC | PRN
  Administered 2012-02-15: 3000 mL

## 2012-02-15 SURGICAL SUPPLY — 65 items
APPLICATOR COTTON TIP 6IN STRL (MISCELLANEOUS) IMPLANT
BENZOIN TINCTURE PRP APPL 2/3 (GAUZE/BANDAGES/DRESSINGS) IMPLANT
BLADE SURG 15 STRL LF DISP TIS (BLADE) ×1 IMPLANT
BLADE SURG 15 STRL SS (BLADE) ×1
CABLE HIGH FREQUENCY MONO STRZ (ELECTRODE) ×2 IMPLANT
CANISTER SUCTION 2500CC (MISCELLANEOUS) ×2 IMPLANT
CLIP SUT LAPRA TY ABSORB (SUTURE) ×4 IMPLANT
CLOTH BEACON ORANGE TIMEOUT ST (SAFETY) ×2 IMPLANT
COVER SURGICAL LIGHT HANDLE (MISCELLANEOUS) ×2 IMPLANT
DEVICE SUTURE ENDOST 10MM (ENDOMECHANICALS) ×2 IMPLANT
DISSECTOR BLUNT TIP ENDO 5MM (MISCELLANEOUS) ×2 IMPLANT
DRAIN PENROSE 18X1/4 LTX STRL (WOUND CARE) ×2 IMPLANT
DRAPE CAMERA CLOSED 9X96 (DRAPES) ×2 IMPLANT
GAUZE SPONGE 4X4 16PLY XRAY LF (GAUZE/BANDAGES/DRESSINGS) ×2 IMPLANT
GEL PDS (MISCELLANEOUS) ×2 IMPLANT
GLOVE BIOGEL M 8.0 STRL (GLOVE) ×2 IMPLANT
GLOVE SURG SIGNA 7.5 PF LTX (GLOVE) ×2 IMPLANT
GLOVE SURG SS PI 7.5 STRL IVOR (GLOVE) ×2 IMPLANT
GOWN STRL NON-REIN LRG LVL3 (GOWN DISPOSABLE) ×2 IMPLANT
GOWN STRL REIN XL XLG (GOWN DISPOSABLE) ×6 IMPLANT
HANDLE STAPLE EGIA 4 XL (STAPLE) ×2 IMPLANT
HOVERMATT SINGLE USE (MISCELLANEOUS) ×2 IMPLANT
KIT BASIN OR (CUSTOM PROCEDURE TRAY) ×2 IMPLANT
KIT GASTRIC LAVAGE 34FR ADT (SET/KITS/TRAYS/PACK) ×2 IMPLANT
NEEDLE SPNL 22GX3.5 QUINCKE BK (NEEDLE) ×2 IMPLANT
NS IRRIG 1000ML POUR BTL (IV SOLUTION) ×2 IMPLANT
PACK CARDIOVASCULAR III (CUSTOM PROCEDURE TRAY) ×2 IMPLANT
PEN SKIN MARKING BROAD (MISCELLANEOUS) ×2 IMPLANT
RELOAD EGIA 45 MED/THCK PURPLE (STAPLE) ×4 IMPLANT
RELOAD EGIA 45 TAN VASC (STAPLE) ×2 IMPLANT
RELOAD EGIA 60 MED/THCK PURPLE (STAPLE) ×6 IMPLANT
RELOAD EGIA 60 TAN VASC (STAPLE) ×2 IMPLANT
RELOAD ENDO STITCH 2.0 (ENDOMECHANICALS) ×10
SCISSORS LAP 5X35 DISP (ENDOMECHANICALS) ×2 IMPLANT
SEALANT SURGICAL APPL DUAL CAN (MISCELLANEOUS) ×2 IMPLANT
SET IRRIG TUBING LAPAROSCOPIC (IRRIGATION / IRRIGATOR) ×2 IMPLANT
SHEARS CURVED HARMONIC AC 45CM (MISCELLANEOUS) ×2 IMPLANT
SLEEVE ADV FIXATION 12X100MM (TROCAR) ×4 IMPLANT
SLEEVE ADV FIXATION 5X100MM (TROCAR) ×2 IMPLANT
SLEEVE Z-THREAD 12X100MM (TROCAR) IMPLANT
SLEEVE Z-THREAD 5X100MM (TROCAR) IMPLANT
SOLUTION ANTI FOG 6CC (MISCELLANEOUS) ×2 IMPLANT
SPONGE GAUZE 4X4 12PLY (GAUZE/BANDAGES/DRESSINGS) ×2 IMPLANT
STAPLER VISISTAT 35W (STAPLE) ×2 IMPLANT
STRIP CLOSURE SKIN 1/2X4 (GAUZE/BANDAGES/DRESSINGS) IMPLANT
STRIP PERI DRY VERITAS 60 (STAPLE) ×2 IMPLANT
SUT RELOAD ENDO STITCH 2 48X1 (ENDOMECHANICALS) ×5
SUT RELOAD ENDO STITCH 2.0 (ENDOMECHANICALS) ×5
SUT VIC AB 2-0 SH 27 (SUTURE) ×1
SUT VIC AB 2-0 SH 27X BRD (SUTURE) ×1 IMPLANT
SUT VIC AB 4-0 SH 18 (SUTURE) ×2 IMPLANT
SUTURE RELOAD END STTCH 2 48X1 (ENDOMECHANICALS) ×5 IMPLANT
SUTURE RELOAD ENDO STITCH 2.0 (ENDOMECHANICALS) ×5 IMPLANT
SYR 20CC LL (SYRINGE) ×2 IMPLANT
SYR 30ML LL (SYRINGE) ×2 IMPLANT
SYR 50ML LL SCALE MARK (SYRINGE) ×2 IMPLANT
TAPE CLOTH SURG 4X10 WHT LF (GAUZE/BANDAGES/DRESSINGS) ×2 IMPLANT
TRAY FOLEY CATH 14FRSI W/METER (CATHETERS) ×2 IMPLANT
TROCAR ADV FIXATION 12X100MM (TROCAR) ×2 IMPLANT
TROCAR XCEL 12X100 BLDLESS (ENDOMECHANICALS) ×2 IMPLANT
TROCAR Z-THREAD FIOS 12X100MM (TROCAR) IMPLANT
TROCAR Z-THREAD FIOS 5X100MM (TROCAR) ×2 IMPLANT
TUBING CONNECTING 10 (TUBING) ×2 IMPLANT
TUBING ENDO SMARTCAP (MISCELLANEOUS) ×2 IMPLANT
TUBING FILTER THERMOFLATOR (ELECTROSURGICAL) ×2 IMPLANT

## 2012-02-15 NOTE — Progress Notes (Signed)
Clear liquid diet as instructed by doctor.

## 2012-02-15 NOTE — Op Note (Signed)
Surgeon: Pollyann Savoy. Daphine Deutscher, MD, FACS Asst:  Ovidio Kin, M.D., [facility] Anesthesia: General endotracheal Drains: None  Procedure: Laparoscopic Roux en Y gastric bypass with 40 cm BP limb and 100 cm Roux limb, antecolic, antegastric, candy cane to the left.  Closure of Peterson's defect. Upper endoscopy.   Description of Procedure:  The patient was taken to OR 1 at [facility] and given general anesthesia.  The abdomen was prepped with PCMX and draped sterilely.  A time out was performed.    The operation began by identifying the ligament of Treitz. I measured 40 cm downstream and divided the bowel with a 6 cm Covidian stapler.  I sutured a Penrose drain along the Roux limb end.  I measured a 1 meter (100 cm) Roux limb and then placed the distal bowels to the BP limb side by side and performed a stapled jejunojejunostomy. The common defect was closed from either end with 4-0 Vicryl using the Endo Stitch. The mesenteric defect was closed with a running 2-0 silk using the Endo Stitch. Tisseel was applied to the suture line.  The omentum was divided with the harmonic scalpel.  The Nathanson retractor was inserted in the left lateral segment of liver was retracted. The foregut dissection ensued.  I measured 5 cm along the lesser curvature and marked that spot. I entered in the retrogastric space. 2 firings of the purple load Covidien were then deployed to begin the pouch and then to 6 loads and one 4.5 with Peri-Strips were used to complete the creation of the pouch.  The Roux limb was then brought up with the candycane pointed left and a back row of sutures of 2-0 Vicryl were placed. I opened along the right side of each structure and inserted the 4.5 cm stapler to create the gastrojejunostomy. The common defect was closed from either end with 2-0 Vicryl and a second row was placed anterior to that the Ewald tube acting as a stent across the anastomosis. The Penrose drain was removed. Peterson's defect was closed with  2-0 silk.   Endoscopy was performed by Dr. Ezzard Standing which showed a small 5 cm pouch with good gastrojejunostomy. No bleeding. No bubbles were seen within this area was submerged. Tisseel was applied to the gastrojejunostomy.  The incisions were injected with Exparel and were closed with 4-0 Vicryl and staples  The patient was taken to the recovery room in satisfactory condition.  Matt B. Daphine Deutscher, MD, FACS

## 2012-02-15 NOTE — Transfer of Care (Signed)
Immediate Anesthesia Transfer of Care Note  Patient: Brianna Nicholson  Procedure(s) Performed: Procedure(s) (LRB): LAPAROSCOPIC ROUX-EN-Y GASTRIC (N/A)  Patient Location: PACU  Anesthesia Type: General  Level of Consciousness: sedated  Airway & Oxygen Therapy: Patient Spontanous Breathing and Patient connected to face mask oxygen  Post-op Assessment: Report given to PACU RN and Post -op Vital signs reviewed and stable  Post vital signs: Reviewed and stable  Complications: No apparent anesthesia complications

## 2012-02-15 NOTE — Anesthesia Postprocedure Evaluation (Signed)
  Anesthesia Post-op Note  Patient: Brianna Nicholson  Procedure(s) Performed: Procedure(s) (LRB): LAPAROSCOPIC ROUX-EN-Y GASTRIC (N/A)  Patient Location: PACU  Anesthesia Type: General  Level of Consciousness: awake and alert   Airway and Oxygen Therapy: Patient Spontanous Breathing  Post-op Pain: mild  Post-op Assessment: Post-op Vital signs reviewed, Patient's Cardiovascular Status Stable, Respiratory Function Stable, Patent Airway and No signs of Nausea or vomiting  Post-op Vital Signs: stable  Complications: No apparent anesthesia complications

## 2012-02-15 NOTE — Anesthesia Procedure Notes (Signed)
Procedure Name: Intubation Date/Time: 02/15/2012 12:42 PM Performed by: [place], Brisha Mccabe C Pre-anesthesia Checklist: Patient identified, Timeout performed, Emergency Drugs available, Suction available and Patient being monitored Patient Re-evaluated:Patient Re-evaluated prior to inductionOxygen Delivery Method: Circle system utilized Preoxygenation: Pre-oxygenation with 100% oxygen Intubation Type: IV induction Ventilation: Mask ventilation without difficulty Laryngoscope Size: Mac and 3 Grade View: Grade I Tube type: Oral Tube size: 7.5 mm Number of attempts: 1 Airway Equipment and Method: Stylet Placement Confirmation: ETT inserted through vocal cords under direct vision,  breath sounds checked- equal and bilateral,  positive ETCO2 and CO2 detector Secured at: 21 cm Tube secured with: Tape Dental Injury: Teeth and Oropharynx as per pre-operative assessment

## 2012-02-15 NOTE — Progress Notes (Signed)
Pt placed on CPAP at home setting of 11 cmH2o with 2 L/M O2 bled-in.  Pt is tolerating well at this time.  VS are stable.  Pt is using home nasal mask.  Pt encouraged to notify RN/ RT of any concerns.

## 2012-02-15 NOTE — Anesthesia Preprocedure Evaluation (Signed)
Anesthesia Evaluation  Patient identified by MRN, date of birth, ID band Patient awake    Reviewed: Allergy & Precautions, H&P , NPO status , Patient's Chart, lab work & pertinent test results  Airway Mallampati: II TM Distance: >3 FB Neck ROM: Full    Dental No notable dental hx.    Pulmonary sleep apnea ,  breath sounds clear to auscultation  Pulmonary exam normal       Cardiovascular negative cardio ROS  Rhythm:Regular Rate:Normal     Neuro/Psych PSYCHIATRIC DISORDERS Depression negative neurological ROS     GI/Hepatic negative GI ROS, Neg liver ROS,   Endo/Other  negative endocrine ROS  Renal/GU negative Renal ROS  negative genitourinary   Musculoskeletal negative musculoskeletal ROS (+)   Abdominal (+) + obese,   Peds negative pediatric ROS (+)  Hematology negative hematology ROS (+)   Anesthesia Other Findings   Reproductive/Obstetrics negative OB ROS                           Anesthesia Physical Anesthesia Plan  ASA: III  Anesthesia Plan: General   Post-op Pain Management:    Induction: Intravenous  Airway Management Planned: Oral ETT  Additional Equipment:   Intra-op Plan:   Post-operative Plan: Extubation in OR  Informed Consent: I have reviewed the patients History and Physical, chart, labs and discussed the procedure including the risks, benefits and alternatives for the proposed anesthesia with the patient or authorized representative who has indicated his/her understanding and acceptance.   Dental advisory given  Plan Discussed with: CRNA  Anesthesia Plan Comments:         Anesthesia Quick Evaluation

## 2012-02-16 ENCOUNTER — Inpatient Hospital Stay (HOSPITAL_COMMUNITY): Payer: BC Managed Care – PPO

## 2012-02-16 DIAGNOSIS — Z9889 Other specified postprocedural states: Secondary | ICD-10-CM

## 2012-02-16 LAB — DIFFERENTIAL
Basophils Relative: 0 % (ref 0–1)
Eosinophils Absolute: 0 10*3/uL (ref 0.0–0.7)
Monocytes Absolute: 1.1 10*3/uL — ABNORMAL HIGH (ref 0.1–1.0)
Neutrophils Relative %: 82 % — ABNORMAL HIGH (ref 43–77)

## 2012-02-16 LAB — HEMOGLOBIN AND HEMATOCRIT, BLOOD: Hemoglobin: 13.2 g/dL (ref 12.0–15.0)

## 2012-02-16 LAB — CBC
MCH: 30.2 pg (ref 26.0–34.0)
MCHC: 33.8 g/dL (ref 30.0–36.0)
Platelets: 295 10*3/uL (ref 150–400)

## 2012-02-16 MED ORDER — IOHEXOL 300 MG/ML  SOLN
50.0000 mL | Freq: Once | INTRAMUSCULAR | Status: AC | PRN
  Administered 2012-02-16: 50 mL via ORAL

## 2012-02-16 NOTE — Progress Notes (Signed)
Dr. Daphine Deutscher notified of UGI results and orders received to begin POD #1 diet; Colin Mulders, RN aware of results and orders. Talmadge Chad, RN Bariatric Nurse Coordinator

## 2012-02-16 NOTE — Progress Notes (Signed)
Patient ID: Brianna Nicholson, female   DOB: 05-04-1986, 26 y.o.   MRN: [id number] [facility] Progress Note:   1 Day Post-Op  Subjective: Mental status is good; normal Objective: Vital signs in last 24 hours: Temp:  [97.6 F (36.4 C)-99.6 F (37.6 C)] 99.2 F (37.3 C) (03/26 1000) Pulse Rate:  [90-108] 92  (03/26 1000) Resp:  [11-36] 18  (03/26 1000) BP: (111-163)/(29-93) 163/93 mmHg (03/26 1000) SpO2:  [92 %-97 %] 92 % (03/26 1000) Weight:  [337 lb (152.862 kg)] 337 lb (152.862 kg) (03/25 1744)  Intake/Output from previous day: 03/25 0701 - 03/26 0700 In: 4900 [I.V.:4900] Out: 1745 [Urine:1745] Intake/Output this shift: Total I/O In: -  Out: 625 [Urine:625]  Physical Exam: Work of breathing is  Not increased.  Incisions ok  Lab Results:  Results for orders placed during the hospital encounter of 02/15/12 (from the past 48 hour(s))  PREGNANCY, URINE     Status: Normal   Collection Time   02/15/12  9:15 AM      Component Value Range Comment   Preg Test, Ur NEGATIVE  NEGATIVE    CBC     Status: Abnormal   Collection Time   02/15/12  6:04 PM      Component Value Range Comment   WBC 16.3 (*) 4.0 - 10.5 (K/uL)    RBC 4.38  3.87 - 5.11 (MIL/uL)    Hemoglobin 13.4  12.0 - 15.0 (g/dL)    HCT 95.6  21.3 - 08.6 (%)    MCV 90.2  78.0 - 100.0 (fL)    MCH 30.6  26.0 - 34.0 (pg)    MCHC 33.9  30.0 - 36.0 (g/dL)    RDW 57.8  46.9 - 62.9 (%)    Platelets 288  150 - 400 (K/uL)   CREATININE, SERUM     Status: Normal   Collection Time   02/15/12  6:04 PM      Component Value Range Comment   Creatinine, Ser 0.72  0.50 - 1.10 (mg/dL)    GFR calc non Af Amer >90  >90 (mL/min)    GFR calc Af Amer >90  >90 (mL/min)   CBC     Status: Abnormal   Collection Time   02/16/12  5:00 AM      Component Value Range Comment   WBC 15.8 (*) 4.0 - 10.5 (K/uL) WHITE COUNT CONFIRMED ON SMEAR   RBC 4.20  3.87 - 5.11 (MIL/uL)    Hemoglobin 12.7  12.0 - 15.0 (g/dL)    HCT 52.8  41.3 - 24.4  (%)    MCV 89.5  78.0 - 100.0 (fL)    MCH 30.2  26.0 - 34.0 (pg)    MCHC 33.8  30.0 - 36.0 (g/dL)    RDW 01.0  27.2 - 53.6 (%)    Platelets 295  150 - 400 (K/uL)   DIFFERENTIAL     Status: Abnormal   Collection Time   02/16/12  5:00 AM      Component Value Range Comment   Neutrophils Relative 82 (*) 43 - 77 (%)    Lymphocytes Relative 11 (*) 12 - 46 (%)    Monocytes Relative 7  3 - 12 (%)    Eosinophils Relative 0  0 - 5 (%)    Basophils Relative 0  0 - 1 (%)    Neutro Abs 13.0 (*) 1.7 - 7.7 (K/uL)    Lymphs Abs 1.7  0.7 - 4.0 (K/uL)  Monocytes Absolute 1.1 (*) 0.1 - 1.0 (K/uL)    Eosinophils Absolute 0.0  0.0 - 0.7 (K/uL)    Basophils Absolute 0.0  0.0 - 0.1 (K/uL)    WBC Morphology WHITE COUNT CONFIRMED ON SMEAR       Radiology/Results: Dg Ugi W/water Sol Cm  02/16/2012  *RADIOLOGY REPORT*  Clinical Data:Postop gastric bypass.  UPPER GI WITH WATER SOLUTION CONTRAST  Fluoroscopy Time:1.4 minutes.  Contrast: 50 ml Omnipaque-300.  Comparison: None.  Findings: Water-soluble study demonstrates normal distal esophagus. Small gastric pouch noted.  There is prompt emptying into the small bowel.  No evidence of leak.  The study was continued distally show no obstruction or leak at the distal anastomosis.  IMPRESSION: Normal postoperative appearance.  No obstruction or leak.  Original Report Authenticated By: Cyndie Chime, M.D.    Anti-infectives: Anti-infectives     Start     Dose/Rate Route Frequency Ordered Stop   02/15/12 0600   cefOXitin (MEFOXIN) 2 g in dextrose 5 % 50 mL IVPB        2 g 100 mL/hr over 30 Minutes Intravenous 60 min pre-op 02/14/12 1805 02/15/12 1245          Assessment/Plan: Problem List: Patient Active Problem List  Diagnoses  . ADHD (attention deficit hyperactivity disorder)  . OSA (obstructive sleep apnea)  . Obesity-BMI 55    Advance to pd 1 diet.   1 Day Post-Op    LOS: 1 day   Matt B. Daphine Deutscher, MD, [facility] [phone] beeper [phone]  02/16/2012 1:27 PM

## 2012-02-16 NOTE — Progress Notes (Signed)
Pt alert and oriented; family at bedside; pt pleasant; VSS; denies any nausea or vomiting; has had some belching; denies flatus or BM at this time; c/o some abdominal discomfort but using prn pain meds with relief; ambulating without difficulty; voiding well; remains NPO; awaiting UGI; doppler study negative; instructed on use of incentive spirometer and pt verbalized understanding of; discharge instructions given for pt to review; questions answered. Continue current plan of care. GASTRIC BYPASS/SLEEVE DISCHARGE INSTRUCTIONS  Drs. Fredrik Rigger, [place], [place], and [place] Call if you have any problems.   Call [phone] and ask for the surgeon on call.    If you need immediate assistance come to the ER at [facility]. Tell the ER personnel that you are a new post-op gastric bypass patient. Signs and symptoms to report:   Severe vomiting or nausea. If you cannot tolerate clear liquids for longer than 1 day, you need to call your surgeon.    Abdominal pain which does not get better after taking your pain medication   Fever greater than 101 F degree   Difficulty breathing   Chest pain    Redness, swelling, drainage, or foul odor at incision sites    If your incisions open or pull apart   Swelling or pain in calf (lower leg)   Diarrhea, frequent watery, uncontrolled bowel movements.   Constipation, (no bowel movements for 3 days) if this occurs, Take Milk of Magnesia, 2 tablespoons by mouth, 3 times a day for 2 days if needed.  Call your doctor if constipation continues. Stop taking Milk of Magnesia once you have had a bowel movement. You may also use Miralax according to the label instructions.   Anything you consider "abnormal for you".   Normal side effects after Surgery:   Unable to sleep at night or concentrate   Irritability   Being tearful (crying) or depressed   These are common complaints, possibly related to your anesthesia, stress of surgery and change in lifestyle, that usually  go away a few weeks after surgery.  If these feelings continue, call your medical doctor.  Wound Care You may have surgical glue, steri-strips, or staples over your incisions after surgery.  Surgical glue:  Looks like a clear film over your incisions and will wear off gradually. Steri-strips: Strips of tape over your incisions. You may notice a yellowish color on the skin underneath the steri-strips. This is a substance used to make the steri-strips stick better. Do not pull the steri-strips off - let them fall off.  Staples: Cherlynn Polo may be removed before you leave the hospital. If you go home with staples, call [facility] [phone]) for an appointment with your surgeon's nurse to have staples removed in 7 - 10 days. Showering: You may shower two days after your surgery unless otherwise instructed by your surgeon. Wash gently around wounds with warm soapy water, rinse well, and gently pat dry.  If you have a drain, you may need someone to hold this while you shower. Avoid tub baths until staples are removed and incisions are healed.    Medications   Medications should be liquid or crushed if larger than the size of a dime.  Extended release pills should not be crushed.   Depending on the size and number of medications you take, you may need to stagger/change the time you take your medications so that you do not over-fill your pouch.    Make sure you follow-up with your primary care physician to make medication  adjustments needed during rapid weight loss and life-style adjustment.   If you are diabetic, follow up with the doctor that prescribes your diabetes medication(s) within one week after surgery and check your blood sugar regularly.   Do not drive while taking narcotics!   Do not take acetaminophen (Tylenol) and Roxicet or Lortab Elixir at the same time since these pain medications contain acetaminophen.  Diet at home: (First 2 Weeks) You will see the nutritionist two weeks  after your surgery. She will advance your diet if you are tolerating liquids well. Once at home, if you have severe vomiting or nausea and cannot tolerate clear liquids lasting longer than 1 day, call your surgeon.  Begin high protein shake 2 ounces every 3 hours, 5 - 6 times per day.  Gradually increase the amount you drink as tolerated.  You may find it easier to slowly sip shakes throughout the day.  It is important to get your proteins in first.   Protein Shake   Drink at least 2 ounces of shake 5-6 times per day   Each serving of protein shakes should have a minimum of 15 grams of protein and no more than 5 grams of carbohydrate    Increase the amount of protein shake you drink as tolerated   Protein powder may be added to fluids such as non-fat milk or Lactaid milk (limit to 20 grams added protein powder per serving   The initial goal is to drink at least 8 ounces of protein shake/drink per day (or as directed by the nutritionist). Some examples of protein shakes are [employer], Dillard's, EAS Edge HP, and Unjury. Hydration   Gradually increase the amount of water and other liquids as tolerated (See Acceptable Fluids)   Gradually increase the amount of protein shake as tolerated     Sip fluids slowly and throughout the day   May use Sugar substitutes, use sparingly (limit to 6 - 8 packets per day). Your fluid goal is 64 ounces of fluid daily. It may take a few weeks to build up to this.         32 oz (or more) should be clear liquids and 32 oz (or more) should be full liquids.         Liquids should not contain sugar, caffeine, or carbonation! Acceptable Fluids Clear Liquids:   Water or Sugar-free flavored water, Fruit H2O   Decaffeinated coffee or tea (sugar-free)   Crystal Lite, Wyler's Lite, Minute Maid Lite   Sugar-free Jell-O   Bouillon or broth   Sugar-free Popsicle:   *Less than 20 calories each; Limit 1 per day   Full Liquids:              Protein Shakes/Drinks + 2  choices per day of other full liquids shown below.    Other full liquids must be: No more than 12 grams of Carbs per serving,  No more than 3 grams of Fat per serving   Strained low-fat cream soup   Non-Fat milk   Fat-free Lactaid Milk   Sugar-free yogurt (Dannon Lite & Fit) Vitamins and Minerals (Start 1 day after surgery unless otherwise directed)   2 Chewable Multivitamin / Multimineral Supplement (i.e. Centrum for Adults)   Chewable Calcium Citrate with Vitamin D-3. Take 1500 mg each day.           (Example: 3 Chewable Calcium Plus 600 with Vitamin D-3 can be found at [facility])  Vitamin B-12, 350 - 500 micrograms (oral tablet) each day   Do not mix multivitamins containing iron with calcium supplements; take 2 hours   apart   Do not substitute Tums (calcium carbonate) for your calcium   Menstruating women and those at risk for anemia may need extra iron. Talk with your doctor to see if you need additional iron.    If you need extra iron:  Total daily Iron recommendations (including Vitamins) = 50 - 100 mg Iron/day Do not stop taking or change any vitamins or minerals until you talk to your nutritionist or surgeon. Your nutritionist and / or physician must approve all vitamin and mineral supplements. Exercise For maximum success, begin exercising as soon as your doctor recommends. Make sure your physician approves any physical activity.   Depending on fitness level, begin with a simple walking program   Walk 5-15 minutes each day, 7 days per week.    Slowly increase until you are walking 30-45 minutes per day   Consider joining our BELT program. [phone] or email [email or web address] Things to remember:    You may have sexual relations when you feel comfortable. It is VERY important for female patients to use a reliable birth control method. Fertility often increases after surgery. Do not get pregnant for at least 18 months.   It is very important to keep all follow up appointments  with your surgeon, nutritionist, primary care physician, and behavioral health practitioner. After the first year, please follow up with your bariatric surgeon at least once a year in order to maintain best weight loss results.  [facility]: [phone] [facility]: [phone]   Free counseling is available for you and your family through collaboration between [facility] and [place]. Please call [phone] and leave a message.    Consider purchasing a medical alert bracelet that says you had gastric bypass surgery.    The [facility] has a free Bariatric Surgery Support Group that meets monthly, the 3rd Thursday, 6 pm, Classroom #1, [employer]. You may register online at [email or web address], but registration is not necessary. Select Classes and Support Groups, Bariatric Surgery, or Call [phone]   Do not return to work or drive until cleared by your surgeon   Use your CPAP when sleeping if applicable   Do not lift anything greater than ten pounds for at least two weeks  Talmadge Chad, RN Bariatric Nurse Coordinator

## 2012-02-16 NOTE — Progress Notes (Signed)
VASCULAR LAB PRELIMINARY  PRELIMINARY  PRELIMINARY  PRELIMINARY  Bilateral lower extremity venous duplex completed.    Preliminary report:  Bilateral:  No evidence of DVT, superficial thrombosis, or Baker's Cyst.   Neesa Knapik D, RVS 02/16/2012, 9:29 AM

## 2012-02-17 LAB — DIFFERENTIAL
Basophils Relative: 0 % (ref 0–1)
Eosinophils Absolute: 0 10*3/uL (ref 0.0–0.7)
Eosinophils Relative: 0 % (ref 0–5)
Monocytes Relative: 9 % (ref 3–12)
Neutrophils Relative %: 67 % (ref 43–77)

## 2012-02-17 LAB — CBC
Hemoglobin: 12.6 g/dL (ref 12.0–15.0)
MCH: 30.1 pg (ref 26.0–34.0)
MCHC: 33.6 g/dL (ref 30.0–36.0)
MCV: 89.7 fL (ref 78.0–100.0)

## 2012-02-17 MED ORDER — OXYCODONE-ACETAMINOPHEN 5-325 MG/5ML PO SOLN: 5.0000 mL | ORAL | Status: AC | PRN

## 2012-02-17 NOTE — Progress Notes (Signed)
Pt alert and oriented; sitting up in chair at bedside; father at bedside; VSS; denies any nausea or vomiting; tolerating water well; belching; denies flatus or BM at this time; c/o some abdominal discomfort with relief from prn pain meds; voiding well; ambulating in hallways without difficulty; pt already has follow up appts with [facility] and [facility]; aware of support group and BELT program; discharge instructions reviewed and pt verbalized understanding of; questions answered; will advance to protein shake today; awaiting MD exam. GASTRIC BYPASS/SLEEVE DISCHARGE INSTRUCTIONS  Drs. Fredrik Rigger, [place], [place], and [place] Call if you have any problems.   Call [phone] and ask for the surgeon on call.    If you need immediate assistance come to the ER at [facility]. Tell the ER personnel that you are a new post-op gastric bypass patient. Signs and symptoms to report:   Severe vomiting or nausea. If you cannot tolerate clear liquids for longer than 1 day, you need to call your surgeon.    Abdominal pain which does not get better after taking your pain medication   Fever greater than 101 F degree   Difficulty breathing   Chest pain    Redness, swelling, drainage, or foul odor at incision sites    If your incisions open or pull apart   Swelling or pain in calf (lower leg)   Diarrhea, frequent watery, uncontrolled bowel movements.   Constipation, (no bowel movements for 3 days) if this occurs, Take Milk of Magnesia, 2 tablespoons by mouth, 3 times a day for 2 days if needed.  Call your doctor if constipation continues. Stop taking Milk of Magnesia once you have had a bowel movement. You may also use Miralax according to the label instructions.   Anything you consider "abnormal for you".   Normal side effects after Surgery:   Unable to sleep at night or concentrate   Irritability   Being tearful (crying) or depressed   These are common complaints, possibly related to your anesthesia, stress of  surgery and change in lifestyle, that usually go away a few weeks after surgery.  If these feelings continue, call your medical doctor.  Wound Care You may have surgical glue, steri-strips, or staples over your incisions after surgery.  Surgical glue:  Looks like a clear film over your incisions and will wear off gradually. Steri-strips: Strips of tape over your incisions. You may notice a yellowish color on the skin underneath the steri-strips. This is a substance used to make the steri-strips stick better. Do not pull the steri-strips off - let them fall off.  Staples: Cherlynn Polo may be removed before you leave the hospital. If you go home with staples, call [facility] [phone]) for an appointment with your surgeon's nurse to have staples removed in 7 - 10 days. Showering: You may shower two days after your surgery unless otherwise instructed by your surgeon. Wash gently around wounds with warm soapy water, rinse well, and gently pat dry.  If you have a drain, you may need someone to hold this while you shower. Avoid tub baths until staples are removed and incisions are healed.    Medications   Medications should be liquid or crushed if larger than the size of a dime.  Extended release pills should not be crushed.   Depending on the size and number of medications you take, you may need to stagger/change the time you take your medications so that you do not over-fill your pouch.    Make sure you  follow-up with your primary care physician to make medication adjustments needed during rapid weight loss and life-style adjustment.   If you are diabetic, follow up with the doctor that prescribes your diabetes medication(s) within one week after surgery and check your blood sugar regularly.   Do not drive while taking narcotics!   Do not take acetaminophen (Tylenol) and Roxicet or Lortab Elixir at the same time since these pain medications contain acetaminophen.  Diet at home: (First 2  Weeks) You will see the nutritionist two weeks after your surgery. She will advance your diet if you are tolerating liquids well. Once at home, if you have severe vomiting or nausea and cannot tolerate clear liquids lasting longer than 1 day, call your surgeon.  Begin high protein shake 2 ounces every 3 hours, 5 - 6 times per day.  Gradually increase the amount you drink as tolerated.  You may find it easier to slowly sip shakes throughout the day.  It is important to get your proteins in first.   Protein Shake   Drink at least 2 ounces of shake 5-6 times per day   Each serving of protein shakes should have a minimum of 15 grams of protein and no more than 5 grams of carbohydrate    Increase the amount of protein shake you drink as tolerated   Protein powder may be added to fluids such as non-fat milk or Lactaid milk (limit to 20 grams added protein powder per serving   The initial goal is to drink at least 8 ounces of protein shake/drink per day (or as directed by the nutritionist). Some examples of protein shakes are [employer], Dillard's, E[employer] Edge HP, and Unjury. Hydration   Gradually increase the amount of water and other liquids as tolerated (See Acceptable Fluids)   Gradually increase the amount of protein shake as tolerated     Sip fluids slowly and throughout the day   May use Sugar substitutes, use sparingly (limit to 6 - 8 packets per day). Your fluid goal is 64 ounces of fluid daily. It may take a few weeks to build up to this.         32 oz (or more) should be clear liquids and 32 oz (or more) should be full liquids.         Liquids should not contain sugar, caffeine, or carbonation! Acceptable Fluids Clear Liquids:   Water or Sugar-free flavored water, Fruit H2O   Decaffeinated coffee or tea (sugar-free)   Crystal Lite, Wyler's Lite, Minute Maid Lite   Sugar-free Jell-O   Bouillon or broth   Sugar-free Popsicle:   *Less than 20 calories each; Limit 1 per day   Full  Liquids:              Protein Shakes/Drinks + 2 choices per day of other full liquids shown below.    Other full liquids must be: No more than 12 grams of Carbs per serving,  No more than 3 grams of Fat per serving   Strained low-fat cream soup   Non-Fat milk   Fat-free Lactaid Milk   Sugar-free yogurt (Dannon Lite & Fit) Vitamins and Minerals (Start 1 day after surgery unless otherwise directed)   2 Chewable Multivitamin / Multimineral Supplement (i.e. Centrum for Adults)   Chewable Calcium Citrate with Vitamin D-3. Take 1500 mg each day.           (Example: 3 Chewable Calcium Plus 600 with Vitamin D-3 can be found  at [facility])         Vitamin B-12, 350 - 500 micrograms (oral tablet) each day   Do not mix multivitamins containing iron with calcium supplements; take 2 hours   apart   Do not substitute Tums (calcium carbonate) for your calcium   Menstruating women and those at risk for anemia may need extra iron. Talk with your doctor to see if you need additional iron.    If you need extra iron:  Total daily Iron recommendations (including Vitamins) = 50 - 100 mg Iron/day Do not stop taking or change any vitamins or minerals until you talk to your nutritionist or surgeon. Your nutritionist and / or physician must approve all vitamin and mineral supplements. Exercise For maximum success, begin exercising as soon as your doctor recommends. Make sure your physician approves any physical activity.   Depending on fitness level, begin with a simple walking program   Walk 5-15 minutes each day, 7 days per week.    Slowly increase until you are walking 30-45 minutes per day   Consider joining our BELT program. [phone] or email [email or web address] Things to remember:    You may have sexual relations when you feel comfortable. It is VERY important for female patients to use a reliable birth control method. Fertility often increases after surgery. Do not get pregnant for at least 18 months.   It is very  important to keep all follow up appointments with your surgeon, nutritionist, primary care physician, and behavioral health practitioner. After the first year, please follow up with your bariatric surgeon at least once a year in order to maintain best weight loss results.  [facility]: [phone] [facility]: [phone]   Free counseling is available for you and your family through collaboration between [facility] and [place]. Please call [phone] and leave a message.    Consider purchasing a medical alert bracelet that says you had gastric bypass surgery.    The [facility] has a free Bariatric Surgery Support Group that meets monthly, the 3rd Thursday, 6 pm, Classroom #1, [employer]. You may register online at [email or web address], but registration is not necessary. Select Classes and Support Groups, Bariatric Surgery, or Call [phone]   Do not return to work or drive until cleared by your surgeon   Use your CPAP when sleeping if applicable   Do not lift anything greater than ten pounds for at least two weeks  Talmadge Chad, RN Bariatric Nurse Coordinator

## 2012-02-17 NOTE — Op Note (Signed)
NAMESHEVETTE, BESS                   ACCOUNT NO.:  [id number]  MEDICAL RECORD NO.:  [id number]  LOCATION:  1529                         FACILITY:  [facility]  PHYSICIAN:  Sandria Bales. Ezzard Standing, M.D.  DATE OF BIRTH:  July 22, 1986  DATE OF PROCEDURE: 02/15/2012                              OPERATIVE REPORT  PREOPERATIVE DIAGNOSES:  Morbid obesity, status post Roux-en-Y gastric bypass.  POSTOPERATIVE DIAGNOSES:  Morbid obesity, status post Roux-en-Y gastric bypass.  PROCEDURE:  Esophagogastroduodenoscopy.  SURGEON:  Sandria Bales. Ezzard Standing, M.D.  FIRST ASSISTANT:  None.  ANESTHESIA:  General endotracheal.  ESTIMATED BLOOD LOSS:  None.  INDICATIONS FOR PROCEDURE:  Ms. Schrum is a 26 year old white female who is morbidly obese who now comes for a laparoscopic Roux-en-Y gastric bypass by Dr. Luretha Murphy.  He completed the procedure and I am doing upper endoscopy to evaluate the gastric pouch, to evaluate the mucosa and evaluate the anastomosis.  OPERATIVE NOTE:  The patient was in supine position in room number 1, under general anesthesia.  Dr. Daphine Deutscher was manning the laparoscope.  I passed a flexible Olympus endoscope down the back of the throat into the stomach pouch.    I identified the esophagogastric junction at about 42 cm.  The gastric pouch was viable, the staple line looked good.  There was no bleeding.  The anastomosis was about 47 cm for a 5 cm pouch.  I insufflated air into the pouch while Dr. Daphine Deutscher flooded the upper abdomen with saline.  There was no bubbling or evidence of air leak.  I then withdrew the scope back into the esophagus, it was normal.  It is felt to be a normal upper endoscopy post Roux-en-Y gastric bypass.  The patient tolerated the procedure well.  Dr. Daphine Deutscher will dictate the remainder of the operation.  Sandria Bales. Ezzard Standing, M.D., FACS  DHN/MEDQ  D:  02/15/2012  T:  02/17/2012  Job:  [id number]

## 2012-02-19 ENCOUNTER — Encounter (HOSPITAL_COMMUNITY): Payer: Self-pay | Admitting: Surgery

## 2012-02-22 NOTE — Discharge Summary (Signed)
Physician Discharge Summary  Patient ID: Graysen Depaula MRN: [id number] DOB/AGE: Dec 16, 1985 26 y.o.  Admit date: 02/15/2012 Discharge date: 02/22/2012  Admission Diagnoses:  Morbid obesity  Discharge Diagnoses:  Same, post lap roux en Y gastric bypass  Active Problems:  * No active hospital problems. *    Surgery:  Laparoscopic roux en Y gastric bypass  Discharged Condition: improved  Hospital Course:   Had gastric bypass.  PD 1 had UGI which looked good.  Diet begun and advanced and ready for discharge on 3.27.13  Consults: none  Significant Diagnostic Studies: UGI looked good    Discharge Exam: Blood pressure 133/87, pulse 91, temperature 98.7 F (37.1 C), temperature source Oral, resp. rate 18, height 5\' 6"  (1.676 m), weight 337 lb (152.862 kg), last menstrual period 01/10/2012, SpO2 97.00%. Incisions look good  Disposition: 01-Home or Self Care  Discharge Orders    Future Appointments: Provider: Department: Dept Phone: Center:   03/03/2012 11:25 AM Valarie Merino, MD [facility] [phone] None     Future Orders Please Complete By Expires   Diet Carb Modified      Increase activity slowly      Remove dressing in 48 hours        Medication List  As of 02/22/2012 11:59 AM   TAKE these medications         methylphenidate 20 MG tablet   Commonly known as: RITALIN   Take 20 mg by mouth 3 (three) times daily. Patient states that she only takes as needed. Maybe 5 to 7 tablets per week.      oxyCODONE-acetaminophen 5-325 MG/5ML solution   Commonly known as: ROXICET   Take 5-10 mLs by mouth every 4 (four) hours as needed.             SignedLuretha Murphy B 02/22/2012, 11:59 AM

## 2012-03-01 ENCOUNTER — Ambulatory Visit: Payer: BC Managed Care – PPO

## 2012-03-03 ENCOUNTER — Encounter (INDEPENDENT_AMBULATORY_CARE_PROVIDER_SITE_OTHER): Payer: Self-pay | Admitting: Surgery

## 2012-03-03 ENCOUNTER — Ambulatory Visit (INDEPENDENT_AMBULATORY_CARE_PROVIDER_SITE_OTHER): Payer: BC Managed Care – PPO | Admitting: Surgery

## 2012-03-03 VITALS — BP 142/98 | HR 88 | Temp 98.0°F | Resp 18 | Ht 66.0 in | Wt 318.2 lb

## 2012-03-03 DIAGNOSIS — Z9884 Bariatric surgery status: Secondary | ICD-10-CM

## 2012-03-03 NOTE — Progress Notes (Signed)
Brianna Nicholson 26 y.o.  Body mass index is 51.37 kg/(m^2).  Patient Active Problem List  Diagnoses  . ADHD (attention deficit hyperactivity disorder)  . OSA (obstructive sleep apnea)  . Obesity-BMI 55    No Known Allergies  Past Surgical History  Procedure Date  . Lithotripsy 2010, 2011  . Upj obstruction 1984  . Kidney stone surgery 2012  . Gastric roux-en-y 02/15/2012    Procedure: LAPAROSCOPIC ROUX-EN-Y GASTRIC;  Surgeon: Valarie Merino, MD;  Location: [facility];  Service: General;  Laterality: N/A;   No primary provider on file. No diagnosis found.  Not having any nausea after surgery. She is starting to exercise more. She's loss right 25 pounds since her Roux-en-Y gastric bypass. I think she looks good time to see her back in 6 weeks. Incisions were healing fine also. Matt B. Daphine Deutscher, MD, [facility] [phone] beeper [phone]  03/03/2012 12:28 PM

## 2012-07-09 IMAGING — CR DG CHEST 2V
2 series · 2 of 2 positions shown · non-contrast
Comparison: None.

CLINICAL DATA: Bariatric screening evaluation.  No current chest
complaints.  Nonsmoker

CHEST - 2 VIEW

[view not recorded (1 of 2)]
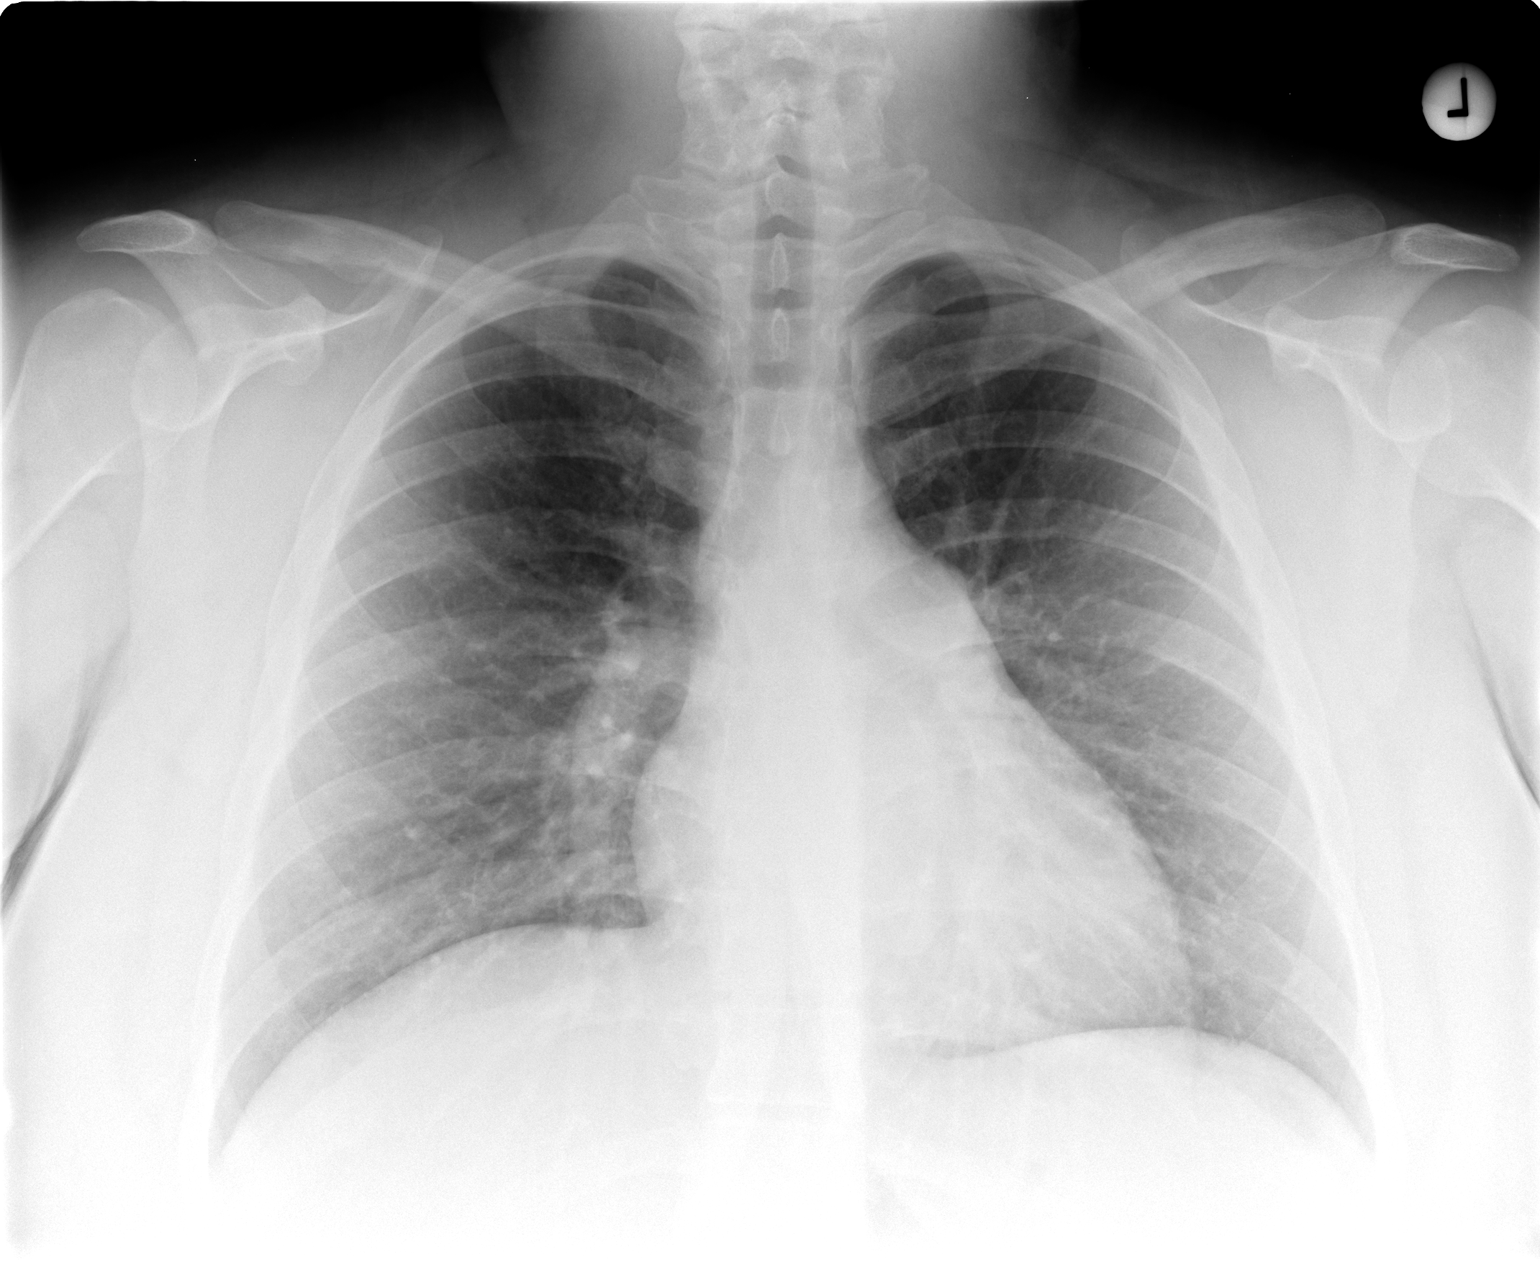

[view not recorded (2 of 2)]
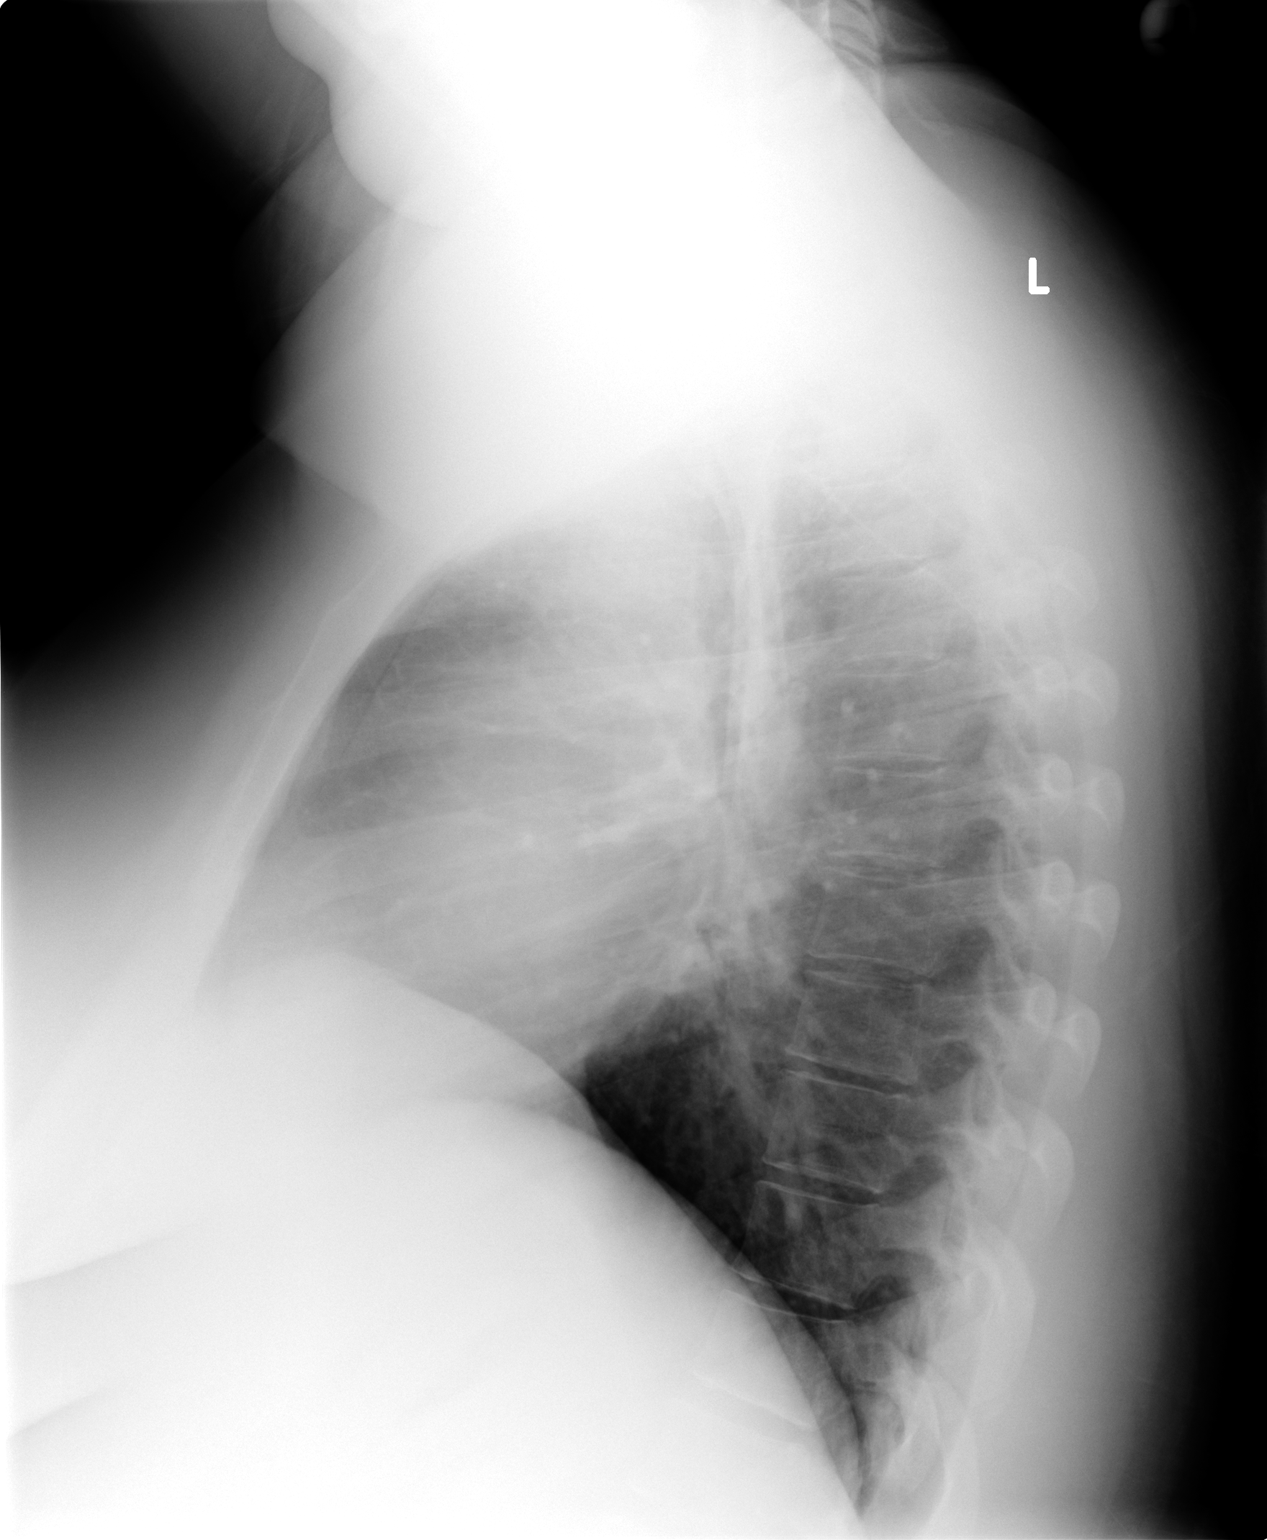

[2 of 2 positions shown; findings below may reference images not displayed]

FINDINGS: Heart and mediastinal contours are within normal limits.
The lung fields are clear with no signs of focal infiltrate or
congestive failure.  No pleural fluid or peribronchial cuffing is
seen.

Bony structures appear intact.
IMPRESSION: No worrisome focal or acute cardiopulmonary abnormality noted.

## 2012-08-12 ENCOUNTER — Telehealth (INDEPENDENT_AMBULATORY_CARE_PROVIDER_SITE_OTHER): Payer: Self-pay | Admitting: Surgery

## 2012-08-12 NOTE — Telephone Encounter (Signed)
I left a voicemail stating the patient should contact [facility] @ [phone] to schedule an appt...cef

## 2013-02-09 ENCOUNTER — Encounter (INDEPENDENT_AMBULATORY_CARE_PROVIDER_SITE_OTHER): Payer: Self-pay | Admitting: Surgery

## 2013-02-09 ENCOUNTER — Ambulatory Visit (INDEPENDENT_AMBULATORY_CARE_PROVIDER_SITE_OTHER): Payer: BC Managed Care – PPO | Admitting: Surgery

## 2013-02-09 ENCOUNTER — Other Ambulatory Visit (INDEPENDENT_AMBULATORY_CARE_PROVIDER_SITE_OTHER): Payer: Self-pay

## 2013-02-09 ENCOUNTER — Other Ambulatory Visit (INDEPENDENT_AMBULATORY_CARE_PROVIDER_SITE_OTHER): Payer: Self-pay | Admitting: Surgery

## 2013-02-09 VITALS — BP 122/80 | HR 78 | Temp 98.4°F | Resp 12 | Ht 66.0 in | Wt 238.8 lb

## 2013-02-09 DIAGNOSIS — Z9884 Bariatric surgery status: Secondary | ICD-10-CM

## 2013-02-09 NOTE — Progress Notes (Signed)
Lapband Fill Encounter Problem List:   Patient Active Problem List  Diagnosis  . ADHD (attention deficit hyperactivity disorder)  . OSA (obstructive sleep apnea)  . Obesity-BMI 55  . 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 Weight loss since surgery  104.4   Having regurgitation?:  no  Feel that they need a fill?  Not a band  Nocturnal reflux?  no  Amount of fill  -   Port site: -  Instructions given and weight loss goals discussed.  Working on [employer].  Frustrated with Oncologist. Overall doing very well having lost 104 pounds. She may be applying to the genetics program at [facility]. We will check labs on her today and get a photograph since this is her one-year anniversary. A please progress she is made she's done very well. Return 6 months  Matt B. Daphine Deutscher, MD, FACS

## 2013-02-09 NOTE — Patient Instructions (Signed)
Check on lab about 1 week after draw

## 2013-02-10 LAB — CBC WITH DIFFERENTIAL/PLATELET
Basophils Absolute: 0 10*3/uL (ref 0.0–0.1)
Basophils Relative: 0 % (ref 0–1)
Eosinophils Absolute: 0 10*3/uL (ref 0.0–0.7)
Eosinophils Relative: 1 % (ref 0–5)
Lymphs Abs: 2.8 10*3/uL (ref 0.7–4.0)
MCH: 30.1 pg (ref 26.0–34.0)
MCHC: 34 g/dL (ref 30.0–36.0)
Neutrophils Relative %: 59 % (ref 43–77)
Platelets: 260 10*3/uL (ref 150–400)
RBC: 4.72 MIL/uL (ref 3.87–5.11)
RDW: 12.8 % (ref 11.5–15.5)

## 2013-02-10 LAB — COMPREHENSIVE METABOLIC PANEL
ALT: 17 U/L (ref 0–35)
AST: 20 U/L (ref 0–37)
Alkaline Phosphatase: 80 U/L (ref 39–117)
Creat: 0.66 mg/dL (ref 0.50–1.10)
Sodium: 142 mEq/L (ref 135–145)
Total Bilirubin: 0.5 mg/dL (ref 0.3–1.2)
Total Protein: 7 g/dL (ref 6.0–8.3)

## 2013-02-10 LAB — LIPID PANEL
Cholesterol: 161 mg/dL (ref 0–200)
Triglycerides: 100 mg/dL (ref <150)
VLDL: 20 mg/dL (ref 0–40)

## 2013-02-10 LAB — IBC PANEL
%SAT: 36 % (ref 20–55)
UIBC: 227 ug/dL (ref 125–400)

## 2013-02-13 LAB — VITAMIN B1: Vitamin B1 (Thiamine): 22 nmol/L (ref 8–30)

## 2013-06-26 ENCOUNTER — Encounter (INDEPENDENT_AMBULATORY_CARE_PROVIDER_SITE_OTHER): Payer: Self-pay | Admitting: Surgery

## 2014-06-06 ENCOUNTER — Telehealth (INDEPENDENT_AMBULATORY_CARE_PROVIDER_SITE_OTHER): Payer: Self-pay

## 2014-06-06 NOTE — Telephone Encounter (Signed)
Pt called stating she has been having diarrhea for a few days now. She goes every hour. She is starting to get fatigued and possibly dehydrated. Let Dr Daphine DeutscherMartin know and he suggests she call PCP for lab and stool sample. If work up unremarkable to call back and he would be glad to see her. Pt understanding

## 2016-09-07 ENCOUNTER — Encounter (HOSPITAL_COMMUNITY): Payer: Self-pay

## 2017-09-20 ENCOUNTER — Encounter (HOSPITAL_COMMUNITY): Payer: Self-pay
